# Patient Record
Sex: Female | Born: 1995 | Race: White | Hispanic: No | Marital: Single | Smoking: Current every day smoker
Health system: Southern US, Community
[De-identification: ages and names within clinical notes are randomized; demographics above are authoritative.]

## PROBLEM LIST (undated history)

## (undated) DIAGNOSIS — F419 Anxiety disorder, unspecified: Secondary | ICD-10-CM

## (undated) HISTORY — PX: KNEE ARTHROSCOPY WITH ANTERIOR CRUCIATE LIGAMENT (ACL) REPAIR: SHX5644

## (undated) HISTORY — PX: TONSILLECTOMY: SUR1361

---
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
## 2019-08-24 ENCOUNTER — Other Ambulatory Visit: Payer: Self-pay

## 2019-08-24 ENCOUNTER — Encounter (HOSPITAL_COMMUNITY): Payer: Self-pay | Admitting: Family Medicine

## 2019-08-24 ENCOUNTER — Ambulatory Visit (HOSPITAL_COMMUNITY)
Admission: EM | Admit: 2019-08-24 | Discharge: 2019-08-24 | Disposition: A | Discharge status: Home / Self Care | Payer: PRIVATE HEALTH INSURANCE | Attending: Family Medicine | Admitting: Family Medicine

## 2019-08-24 DIAGNOSIS — K0889 Other specified disorders of teeth and supporting structures: Secondary | ICD-10-CM

## 2019-08-24 MED ORDER — AMOXICILLIN 875 MG PO TABS: 875.0000 mg | ORAL_TABLET | Freq: Two times a day (BID) | ORAL | 0 refills | Status: DC

## 2019-08-24 NOTE — ED Provider Notes (Signed)
[facility]    CSN: [id number] Arrival date & time: 08/24/19  1500      History   Chief Complaint Chief Complaint  Patient presents with  . Dental Pain    HPI Kim Lynn is a 24 y.o. female.   Initial [facility] visit for this 24 yo woman complaining of post extraction wisdom tooth pain.  She's been given opioids by two different dentists this month.  She had all 4 wisdom teeth extracted 2 weeks ago.  Currently she has discomfort in the right jaw wisdom tooth (#32) that has been increasing over the last 2 weeks.  She has been swishing her mouth with the prescription mouthwash provided and she has an appointment tomorrow with her dentist.  She has had no fever, but she does have a bad taste in her mouth.     Past Medical History:  Diagnosis Date  . Anxiety   . Sullivan Lone syndrome     There are no problems to display for this patient.   History reviewed. No pertinent surgical history.  OB History   No obstetric history on file.      Home Medications    Prior to Admission medications   Medication Sig Start Date End Date Taking? Authorizing Provider  amoxicillin (AMOXIL) 875 MG tablet Take 1 tablet (875 mg total) by mouth 2 (two) times daily. 08/24/19   Elvina Sidle, MD    Family History History reviewed. No pertinent family history.  Social History Social History   Tobacco Use  . Smoking status: Not on file  Substance Use Topics  . Alcohol use: Not on file  . Drug use: Not on file     Allergies   Aspirin and Tylenol [acetaminophen]   Review of Systems Review of Systems  HENT: Positive for dental problem.   All other systems reviewed and are negative.    Physical Exam Triage Vital Signs ED Triage Vitals  Enc Vitals Group     BP      Pulse      Resp      Temp      Temp src      SpO2      Weight      Height      Head Circumference      Peak Flow      Pain Score      Pain Loc      Pain Edu?      Excl. in GC?    No data  found.  Updated Vital Signs BP 127/83 (BP Location: Left Arm)   Pulse (!) 103   Temp 98.7 F (37.1 C) (Oral)   Resp 16   LMP 07/28/2019   SpO2 99%    Physical Exam Vitals and nursing note reviewed.  Constitutional:      Appearance: Normal appearance. She is normal weight.  HENT:     Head: Normocephalic.     Mouth/Throat:     Mouth: Mucous membranes are moist.     Comments: No obvious swelling in the mouth or dental areas that I can see. Eyes:     Conjunctiva/sclera: Conjunctivae normal.  Neck:     Comments: Mildly tender fullness in the right submandibular area Cardiovascular:     Rate and Rhythm: Normal rate and regular rhythm.  Pulmonary:     Effort: Pulmonary effort is normal.  Musculoskeletal:        General: Normal range of motion.     Cervical back:  Normal range of motion and neck supple.  Lymphadenopathy:     Cervical: Cervical adenopathy present.  Skin:    General: Skin is warm and dry.  Neurological:     General: No focal deficit present.     Mental Status: She is alert and oriented to person, place, and time.  Psychiatric:        Mood and Affect: Mood normal.        Behavior: Behavior normal.        Thought Content: Thought content normal.        Judgment: Judgment normal.      UC Treatments / Results  Labs (all labs ordered are listed, but only abnormal results are displayed) Labs Reviewed - No data to display  EKG   Radiology No results found.  Procedures Procedures (including critical care time)  Medications Ordered in UC Medications - No data to display  Initial Impression / Assessment and Plan / UC Course  I have reviewed the triage vital signs and the nursing notes.  Pertinent labs & imaging results that were available during my care of the patient were reviewed by me and considered in my medical decision making (see chart for details).    Final Clinical Impressions(s) / UC Diagnoses   Final diagnoses:  Pain, dental      Discharge Instructions     Continue the ibuprofen for discomfort.  Pick up your antibiotic and start today taking twice a day the amoxicillin prescribed.  Keep your appointment with the dentist tomorrow.    ED Prescriptions    Medication Sig Dispense Auth. Provider   amoxicillin (AMOXIL) 875 MG tablet Take 1 tablet (875 mg total) by mouth 2 (two) times daily. 20 tablet Robyn Haber, MD     I have reviewed the PDMP during this encounter.   Robyn Haber, MD 08/24/19 1626

## 2019-08-24 NOTE — Discharge Instructions (Addendum)
Continue the ibuprofen for discomfort.  Pick up your antibiotic and start today taking twice a day the amoxicillin prescribed.  Keep your appointment with the dentist tomorrow.

## 2019-08-24 NOTE — ED Triage Notes (Signed)
Pt present right side swelling from having her tooth pulled. Pt states that the pain is on the right side and causes problems in her throat

## 2020-05-25 ENCOUNTER — Other Ambulatory Visit: Payer: Self-pay

## 2020-05-25 ENCOUNTER — Encounter: Payer: Self-pay | Admitting: Emergency Medicine

## 2020-05-25 ENCOUNTER — Emergency Department
Admission: EM | Admit: 2020-05-25 | Discharge: 2020-05-25 | Disposition: A | Discharge status: Home / Self Care | Payer: 59 | Attending: Emergency Medicine | Admitting: Emergency Medicine

## 2020-05-25 DIAGNOSIS — F17200 Nicotine dependence, unspecified, uncomplicated: Secondary | ICD-10-CM | POA: Insufficient documentation

## 2020-05-25 DIAGNOSIS — G44209 Tension-type headache, unspecified, not intractable: Secondary | ICD-10-CM | POA: Diagnosis not present

## 2020-05-25 DIAGNOSIS — R519 Headache, unspecified: Secondary | ICD-10-CM | POA: Diagnosis present

## 2020-05-25 MED ORDER — DIPHENHYDRAMINE HCL 50 MG/ML IJ SOLN
25.0000 mg | Freq: Once | INTRAMUSCULAR | Status: AC
  Administered 2020-05-25: 25 mg via INTRAVENOUS
  Filled 2020-05-25: qty 1

## 2020-05-25 MED ORDER — IBUPROFEN 600 MG PO TABS: 600.0000 mg | ORAL_TABLET | Freq: Three times a day (TID) | ORAL | 0 refills | Status: DC | PRN

## 2020-05-25 MED ORDER — LACTATED RINGERS IV BOLUS
1000.0000 mL | Freq: Once | INTRAVENOUS | Status: AC
  Administered 2020-05-25: 1000 mL via INTRAVENOUS

## 2020-05-25 MED ORDER — CYCLOBENZAPRINE HCL 10 MG PO TABS: ORAL_TABLET | ORAL | 0 refills | Status: DC

## 2020-05-25 MED ORDER — KETOROLAC TROMETHAMINE 30 MG/ML IJ SOLN
15.0000 mg | Freq: Once | INTRAMUSCULAR | Status: AC
  Administered 2020-05-25: 15 mg via INTRAVENOUS
  Filled 2020-05-25: qty 1

## 2020-05-25 MED ORDER — PROCHLORPERAZINE EDISYLATE 10 MG/2ML IJ SOLN
10.0000 mg | Freq: Once | INTRAMUSCULAR | Status: AC
  Administered 2020-05-25: 10 mg via INTRAVENOUS
  Filled 2020-05-25: qty 2

## 2020-05-25 NOTE — ED Provider Notes (Signed)
[facility] Emergency Department Provider Note  ____________________________________________   Event Date/Time   First MD Initiated Contact with Patient 05/25/20 [phone]     (approximate)  I have reviewed the triage vital signs and the nursing notes.   HISTORY  Chief Complaint Headache    HPI Kim Lynn is a 24 y.o. female  Here with headache. Pt reports gradual onset of initially mild, now progressively worsening aching, throbbing headache. It began around her occipital and temporal area and is now in a band like distribution throughout her head. She's had associated mild nausea, photophobia. No phonophobia. No neck pain or stiffness. No fever, chills. Reports h/o HA but normally is able to take tylenol or motrin with relief. No thunderclap onset. No personal or family h/o aneurysms. No specific alleviating factors.        Past Medical History:  Diagnosis Date  . Anxiety   . Sullivan Lone syndrome     There are no problems to display for this patient.   History reviewed. No pertinent surgical history.  Prior to Admission medications   Medication Sig Start Date End Date Taking? Authorizing Provider  amoxicillin (AMOXIL) 875 MG tablet Take 1 tablet (875 mg total) by mouth 2 (two) times daily. 08/24/19   Elvina Sidle, MD  cyclobenzaprine (FLEXERIL) 10 MG tablet Take three times a day for 2 days, then up to three times a day as needed for muscle tension/spasms or headache. 05/25/20   Shaune Pollack, MD  ibuprofen (ADVIL) 600 MG tablet Take 1 tablet (600 mg total) by mouth every 8 (eight) hours as needed for headache or moderate pain. 05/25/20   Shaune Pollack, MD    Allergies Aspirin and Tylenol [acetaminophen]  No family history on file.  Social History Social History   Tobacco Use  . Smoking status: Current Every Day Smoker  . Smokeless tobacco: Never Used  Vaping Use  . Vaping Use: Never used    Review of Systems  Review of Systems   Constitutional: Positive for fatigue. Negative for fever.  HENT: Negative for congestion and sore throat.   Eyes: Negative for visual disturbance.  Respiratory: Negative for cough and shortness of breath.   Cardiovascular: Negative for chest pain.  Gastrointestinal: Positive for nausea. Negative for abdominal pain, diarrhea and vomiting.  Genitourinary: Negative for flank pain.  Musculoskeletal: Negative for back pain and neck pain.  Skin: Negative for rash and wound.  Neurological: Positive for headaches. Negative for weakness.  All other systems reviewed and are negative.    ____________________________________________  PHYSICAL EXAM:      VITAL SIGNS: ED Triage Vitals [05/25/20 0645]  Enc Vitals Group     BP (!) 130/92     Pulse Rate (!) 119     Resp 18     Temp 98.3 F (36.8 C)     Temp Source Oral     SpO2 98 %     Weight 165 lb (74.8 kg)     Height 5\' 7"  (1.702 m)     Head Circumference      Peak Flow      Pain Score 8     Pain Loc      Pain Edu?      Excl. in GC?      Physical Exam Vitals and nursing note reviewed.  Constitutional:      General: She is not in acute distress.    Appearance: She is well-developed and well-nourished.  HENT:  Head: Normocephalic and atraumatic.  Eyes:     Conjunctiva/sclera: Conjunctivae normal.  Cardiovascular:     Rate and Rhythm: Normal rate and regular rhythm.     Heart sounds: Normal heart sounds.  Pulmonary:     Effort: Pulmonary effort is normal. No respiratory distress.     Breath sounds: No wheezing.  Abdominal:     General: There is no distension.  Musculoskeletal:        General: No edema.     Cervical back: Neck supple.  Skin:    General: Skin is warm.     Capillary Refill: Capillary refill takes less than 2 seconds.     Findings: No rash.  Neurological:     Mental Status: She is alert and oriented to person, place, and time.     Motor: No abnormal muscle tone.      Neurological Exam:  Mental  Status: Alert and oriented to person, place, and time. Attention and concentration normal. Speech clear. Recent memory is intact. Cranial Nerves: Visual fields grossly intact. EOMI and PERRLA. No nystagmus noted. Facial sensation intact at forehead, maxillary cheek, and chin/mandible bilaterally. No facial asymmetry or weakness. Hearing grossly normal. Uvula is midline, and palate elevates symmetrically. Normal SCM and trapezius strength. Tongue midline without fasciculations. Motor: Muscle strength 5/5 in proximal and distal UE and LE bilaterally. No pronator drift. Muscle tone normal. Sensation: Intact to light touch in upper and lower extremities distally bilaterally.  Gait: Normal without ataxia. Coordination: Normal FTN bilaterally.    ____________________________________________   LABS (all labs ordered are listed, but only abnormal results are displayed)  Labs Reviewed - No data to display  ____________________________________________  EKG: None ________________________________________  RADIOLOGY All imaging, including plain films, CT scans, and ultrasounds, independently reviewed by me, and interpretations confirmed via formal radiology reads.  ED MD interpretation:   None  Official radiology report(s): No results found.  ____________________________________________  PROCEDURES   Procedure(s) performed (including Critical Care):  Procedures  ____________________________________________  INITIAL IMPRESSION / MDM / ASSESSMENT AND PLAN / ED COURSE  As part of my medical decision making, I reviewed the following data within the electronic MEDICAL RECORD NUMBER Nursing notes reviewed and incorporated, Old chart reviewed, Notes from prior ED visits, and [place] Controlled Substance Database       *Kim Lynn was evaluated in Emergency Department on 05/25/2020 for the symptoms described in the history of present illness. She was evaluated in the context of the global  COVID-19 pandemic, which necessitated consideration that the patient might be at risk for infection with the SARS-CoV-2 virus that causes COVID-19. Institutional protocols and algorithms that pertain to the evaluation of patients at risk for COVID-19 are in a state of rapid change based on information released by regulatory bodies including the CDC and federal and state organizations. These policies and algorithms were followed during the patient's care in the ED.  Some ED evaluations and interventions may be delayed as a result of limited staffing during the pandemic.*     Medical Decision Making:  24 yo F here with headache. Suspect tension type headache given gradual onset, band-like distribution, worsening w/ jaw clenching and palpation. DDx includes migraine. No thunderclap onset, no neuro deficits, no neck stiffness, no signs to suggest SAH/aneurysm. No signs of CVA. No fever, chills, or signs of meningitis or encephalitis. Pt has had marked improvement w/ fluids, migraine meds in ED. Will d/c with muscle relaxant given tension component, nsaids PRN, and outpt follow-up.  ____________________________________________  FINAL CLINICAL IMPRESSION(S) / ED DIAGNOSES  Final diagnoses:  Acute non intractable tension-type headache     MEDICATIONS GIVEN DURING THIS VISIT:  Medications  lactated ringers bolus 1,000 mL (0 mLs Intravenous Stopped 05/25/20 0858)  prochlorperazine (COMPAZINE) injection 10 mg (10 mg Intravenous Given 05/25/20 0815)  diphenhydrAMINE (BENADRYL) injection 25 mg (25 mg Intravenous Given 05/25/20 0815)  ketorolac (TORADOL) 30 MG/ML injection 15 mg (15 mg Intravenous Given 05/25/20 0815)     ED Discharge Orders         Ordered    cyclobenzaprine (FLEXERIL) 10 MG tablet        05/25/20 0832    ibuprofen (ADVIL) 600 MG tablet  Every 8 hours PRN        05/25/20 7169           Note:  This document was prepared using Dragon voice recognition software and may include  unintentional dictation errors.   Shaune Pollack, MD 05/25/20 [phone]

## 2020-05-25 NOTE — ED Triage Notes (Signed)
Patient ambulatory to triage with steady gait, without difficulty or distress noted; pt reports x 2 days having migraine, this morning pain increased with nausea; st hx migraines

## 2020-05-25 NOTE — Discharge Instructions (Signed)
Try to get at least 8 hr of sleep  Drink plenty of fluids, at least 6-8 glasses of water daily  Take the Ibuprofen and flexeril as prescribed  You were seen and treated in the emergency department today for headache. Fortunately, your vitals, exam, and work-up is reassuring with no apparent emergent cause for your headache at this time. That being said, it is VERY important that you monitor your symptoms at home. If you develop worsening headache, new fever, new neck stiffness, rash, focal weakness or numbness, or any other new or concerning symptoms, please return to the ED immediately, as these may be signs that your headache has become a potentially serious and life-threatening condition.   Drink plenty of fluids at home. This will help with your headache. Try to avoid daily or regular use of tylenol, aspirin, ibuprofen, and other overt-the-counter pain medications as this can contribute to rebound headaches. Follow-up with your primary care doctor.

## 2020-05-25 NOTE — ED Notes (Signed)
Signature pad not available. Pt verbalizes understanding of d/c instructions and when to return to ED. Denies questions or concerns.

## 2020-10-20 ENCOUNTER — Other Ambulatory Visit: Payer: Self-pay

## 2020-10-20 ENCOUNTER — Emergency Department
Admission: EM | Admit: 2020-10-20 | Discharge: 2020-10-20 | Disposition: A | Discharge status: Home / Self Care | Payer: Managed Care, Other (non HMO) | Attending: Emergency Medicine | Admitting: Emergency Medicine

## 2020-10-20 ENCOUNTER — Encounter: Payer: Self-pay | Admitting: *Deleted

## 2020-10-20 DIAGNOSIS — W228XXA Striking against or struck by other objects, initial encounter: Secondary | ICD-10-CM | POA: Diagnosis not present

## 2020-10-20 DIAGNOSIS — Y92002 Bathroom of unspecified non-institutional (private) residence single-family (private) house as the place of occurrence of the external cause: Secondary | ICD-10-CM | POA: Diagnosis not present

## 2020-10-20 DIAGNOSIS — S0990XA Unspecified injury of head, initial encounter: Secondary | ICD-10-CM | POA: Diagnosis present

## 2020-10-20 DIAGNOSIS — S060X0A Concussion without loss of consciousness, initial encounter: Secondary | ICD-10-CM | POA: Diagnosis not present

## 2020-10-20 DIAGNOSIS — F17200 Nicotine dependence, unspecified, uncomplicated: Secondary | ICD-10-CM | POA: Diagnosis not present

## 2020-10-20 MED ORDER — IBUPROFEN 800 MG PO TABS
800.0000 mg | ORAL_TABLET | Freq: Once | ORAL | Status: AC
  Administered 2020-10-20: 800 mg via ORAL
  Filled 2020-10-20: qty 1

## 2020-10-20 MED ORDER — METOCLOPRAMIDE HCL 5 MG PO TABS: 5.0000 mg | ORAL_TABLET | Freq: Three times a day (TID) | ORAL | 0 refills | Status: DC | PRN

## 2020-10-20 MED ORDER — DIPHENHYDRAMINE HCL 25 MG PO CAPS
25.0000 mg | ORAL_CAPSULE | Freq: Once | ORAL | Status: AC
  Administered 2020-10-20: 25 mg via ORAL
  Filled 2020-10-20: qty 1

## 2020-10-20 MED ORDER — METOCLOPRAMIDE HCL 10 MG PO TABS: 10.0000 mg | ORAL_TABLET | Freq: Once | ORAL | Status: DC

## 2020-10-20 MED ORDER — METOCLOPRAMIDE HCL 10 MG PO TABS
10.0000 mg | ORAL_TABLET | Freq: Once | ORAL | Status: AC
  Administered 2020-10-20: 10 mg via ORAL
  Filled 2020-10-20: qty 1

## 2020-10-20 NOTE — ED Triage Notes (Signed)
Pt brought in via ems from work  Pt hit back of head on paper towel holder in bathroom   No swelling no lac  No loc  Pt alert  Speech clear.

## 2020-10-20 NOTE — Discharge Instructions (Addendum)
Your exam is normal and reassuring at this time.  No signs of a serious closed head injury or intracranial bleed.  Your symptoms are more consistent with a mild concussion.  Take over-the-counter Tylenol or Motrin along with prescription nausea medicine as needed.  May also take OTC Benadryl for headache pain relief.  Follow-up with your primary provider or return to the ED if necessary.

## 2020-10-20 NOTE — ED Provider Notes (Signed)
[facility] Emergency Department Provider Note  ____________________________________________   Event Date/Time   First MD Initiated Contact with Patient 10/20/20 1910     (approximate)  I have reviewed the triage vital signs and the nursing notes.   HISTORY  Chief Complaint Head Injury  HPI Kim Lynn is a 25 y.o. female presents to the ED via EMS, for evaluation of a head injury.  She reportedly stood up while in the bathroom, and inadvertently hit the back of her head on the wall attached paper towel rack.  Patient reports her vision went black for several seconds, but denies any loss of consciousness.  Has any swelling to the scalp or laceration to the head.  She denies any ongoing visual disturbance but does note feeling like she is "floating."  Patient has had multiple concussions in the past, and reports her symptoms feel similar.  She reports a continued posterior headache but denies any distal paresthesias, weakness, or paralysis.  She also denies any other injury at this time.  Patient rates her headache at a 6 out of 10 at this time.   Reports the incident happened at about 5:00 at work.  She remained at work but was seated in the managers office for least an hour before she was advised to report to the ED.  She denies any interim worsening of her symptoms including nausea or vomiting.   Past Medical History:  Diagnosis Date  . Anxiety   . Sullivan Lone syndrome     There are no problems to display for this patient.   History reviewed. No pertinent surgical history.  Prior to Admission medications   Medication Sig Start Date End Date Taking? Authorizing Provider  metoCLOPramide (REGLAN) 5 MG tablet Take 1 tablet (5 mg total) by mouth every 8 (eight) hours as needed for nausea or vomiting. 10/20/20  Yes Jabes Primo, Charlesetta Ivory, PA-C    Allergies Aspirin and Tylenol [acetaminophen]  History reviewed. No pertinent family history.  Social  History Social History   Tobacco Use  . Smoking status: Current Every Day Smoker  . Smokeless tobacco: Never Used  Vaping Use  . Vaping Use: Never used  Substance Use Topics  . Alcohol use: Not Currently    Review of Systems  Constitutional: No fever/chills Eyes: No visual changes. ENT: No sore throat. Cardiovascular: Denies chest pain. Respiratory: Denies shortness of breath. Gastrointestinal: No abdominal pain.  No nausea, no vomiting.  No diarrhea.  No constipation. Genitourinary: Negative for dysuria. Musculoskeletal: Negative for back pain. Skin: Negative for rash. Neurological: Positive for headaches.  Denies any focal weakness or numbness. ____________________________________________   PHYSICAL EXAM:  VITAL SIGNS: ED Triage Vitals  Enc Vitals Group     BP      Pulse      Resp      Temp      Temp src      SpO2      Weight      Height      Head Circumference      Peak Flow      Pain Score      Pain Loc      Pain Edu?      Excl. in GC?     Constitutional: Alert and oriented. Well appearing and in no acute distress. Eyes: Conjunctivae are normal. PERRL. EOMI. normal fundi bilaterally. Head: Atraumatic.  Mildly tender to palpation to the crown of the head.  No hematoma, or abrasion, or  erythema is appreciated. Nose: No congestion/rhinnorhea. Mouth/Throat: Mucous membranes are moist.  Oropharynx non-erythematous. Neck: No stridor.  No cervical spine tenderness to palpation. Cardiovascular: Normal rate, regular rhythm. Grossly normal heart sounds.  Good peripheral circulation. Respiratory: Normal respiratory effort.  No retractions. Lungs CTAB. Gastrointestinal: Soft and nontender. No distention. No abdominal bruits. No CVA tenderness. Musculoskeletal: No lower extremity tenderness nor edema.  No joint effusions. Neurologic: Cranial nerves II to XII grossly intact.  Normal tandem walk.  No cerebellar ataxia appreciated.  Normal finger-nose exam.  Negative  Romberg and no pronator drift..  Normal speech and language. No gross focal neurologic deficits are appreciated. No gait instability. Skin:  Skin is warm, dry and intact. No rash noted. Psychiatric: Mood and affect are normal. Speech and behavior are normal.  ____________________________________________   LABS (all labs ordered are listed, but only abnormal results are displayed)  Labs Reviewed - No data to display ____________________________________________  EKG  ____________________________________________  RADIOLOGY I, Lissa Hoard, personally viewed and evaluated these images (plain radiographs) as part of my medical decision making, as well as reviewing the written report by the radiologist.  ED MD interpretation:    Official radiology report(s): No results found.  ____________________________________________   PROCEDURES  Procedure(s) performed (including Critical Care):  Procedures   ____________________________________________   INITIAL IMPRESSION / ASSESSMENT AND PLAN / ED COURSE  As part of my medical decision making, I reviewed the following data within the electronic MEDICAL RECORD NUMBER Notes from prior ED visits   Patient with ED evaluation of a closed head injury. Her exam is benign and reassuring. The incident happened at least an hour prior to her arrival. She reports no worsening of her symptoms, intractable N/V, weakness, or syncope.  No exam evidence of any acute cerebellar ataxia or significant closed head injury.  We discussed the availability of CT imaging at this time.  Patient with a history of multiple head injuries and concussions in the past, declined at this time.  She is well aware of postconcussive symptom management.  She is at this time however, requesting some medicine for treatment of her mild headache.  She will be discharged with a prescription for Reglan as well as [place] take over-the-counter Benadryl with ibuprofen.  She will  follow-up with primary provider return to the ED if necessary.  Work note is provided as requested.  ____________________________________________   FINAL CLINICAL IMPRESSION(S) / ED DIAGNOSES  Final diagnoses:  Closed head injury, initial encounter  Concussion without loss of consciousness, initial encounter     ED Discharge Orders         Ordered    metoCLOPramide (REGLAN) 5 MG tablet  Every 8 hours PRN        10/20/20 2040          *Please note:  Kim Lynn was evaluated in Emergency Department on 10/20/2020 for the symptoms described in the history of present illness. She was evaluated in the context of the global COVID-19 pandemic, which necessitated consideration that the patient might be at risk for infection with the SARS-CoV-2 virus that causes COVID-19. Institutional protocols and algorithms that pertain to the evaluation of patients at risk for COVID-19 are in a state of rapid change based on information released by regulatory bodies including the CDC and federal and state organizations. These policies and algorithms were followed during the patient's care in the ED.  Some ED evaluations and interventions may be delayed as a result of limited staffing during  and the pandemic.*   Note:  This document was prepared using Dragon voice recognition software and may include unintentional dictation errors.    Lissa Hoard, PA-C 10/20/20 2049    Chesley Noon, MD 10/20/20 [phone]

## 2021-02-10 LAB — OB RESULTS CONSOLE RUBELLA ANTIBODY, IGM: Rubella: IMMUNE

## 2021-02-10 LAB — OB RESULTS CONSOLE VARICELLA ZOSTER ANTIBODY, IGG: Varicella: NON-IMMUNE/NOT IMMUNE

## 2021-06-12 NOTE — L&D Delivery Note (Signed)
Delivery Note ? ?First Stage: ?Augmentation: Cytotec, Cook Cath, Pitocin, AROM ?Analgesia /Anesthesia intrapartum: stadol, epidural ?AROM at 1129 ? ?Second Stage: ?Complete dilation at 1725 ?Onset of pushing at 1730 ?FHR second stage Cat II tracing, variables and earlies.   ? ?Delivery of a viable female infant on 08/16/21 at 1813 by CNM ?delivery of fetal head in OA position with restitution to LOA. ?No nuchal cord;  Anterior then posterior shoulders delivered easily with gentle downward traction. Short cord noted.  Baby placed on mom's chest, and attended to by peds.  ?Cord double clamped after cessation of pulsation, cut by FOB ? ?Third Stage: ?Placenta delivered spontaneously intact with Hastings @ 1818 ?Placenta disposition: routine disposal ?Uterine tone Firm with massage / bleeding small ? ?2nd deg laceration identified  ?Anesthesia for repair: epidural ?Repair 2-0 Vicryl Ct-1; hemostatic right labial/periurethral. ?Est. Blood Loss (mL): 477ml ? ?Complications: none ? ?Mom to postpartum.  Baby to Couplet care / Skin to Skin. ? ?Newborn: ?Birth Weight: 3450g, 7#10  ?Apgar Scores: 8/9 ?Feeding planned: breast ? ? ? ?

## 2021-07-22 LAB — OB RESULTS CONSOLE GC/CHLAMYDIA
Chlamydia: NEGATIVE
Gonorrhea: NEGATIVE

## 2021-07-22 LAB — OB RESULTS CONSOLE GBS: GBS: NEGATIVE

## 2021-07-22 LAB — OB RESULTS CONSOLE HIV ANTIBODY (ROUTINE TESTING): HIV: NONREACTIVE

## 2021-08-03 ENCOUNTER — Other Ambulatory Visit: Payer: Self-pay | Admitting: Obstetrics

## 2021-08-03 DIAGNOSIS — Z3490 Encounter for supervision of normal pregnancy, unspecified, unspecified trimester: Secondary | ICD-10-CM

## 2021-08-03 NOTE — Progress Notes (Signed)
No obstetric history on file. at [redacted]w[redacted]d, LMP of 11/09/20, c/w early [place] at [redacted]w[redacted]d.  Scheduled for induction of labor for Chronic respiratory issue( persistent asthma, and Covid on 3-7/23.   Prenatal provider: [facility] Pregnancy complicated by: 1.Asthma with COVID infx 07/01/21 2.Maternal mental health diagnosis  3. Varicella non-immune 4.Elevated 1hr GTT   Prenatal Labs: Blood type/Rh AB Pos  Antibody screen neg  Rubella Immune  Varicella Non Immune  RPR NR  HBsAg Neg  HIV NR  GC neg  Chlamydia neg  Genetic screening cfDNA negative  1 hour GTT 173  3 hour GTT 93,210,152,99  GBS Neg   Tdap: 05/26/21 Flu: received antepartum through work Contraception: Arts development officer Feeding preference: Breast  ____ Chari Manning CNM Certified Nurse Midwife [facility]

## 2021-08-16 ENCOUNTER — Inpatient Hospital Stay
Admission: EM | Admit: 2021-08-16 | Discharge: 2021-08-18 | DRG: 806 | Disposition: A | Discharge status: Home / Self Care | Payer: 59 | Attending: Obstetrics | Admitting: Obstetrics

## 2021-08-16 ENCOUNTER — Inpatient Hospital Stay: Payer: 59 | Admitting: Anesthesiology

## 2021-08-16 ENCOUNTER — Encounter: Payer: Self-pay | Admitting: Obstetrics and Gynecology

## 2021-08-16 ENCOUNTER — Other Ambulatory Visit: Payer: Self-pay

## 2021-08-16 DIAGNOSIS — J45909 Unspecified asthma, uncomplicated: Secondary | ICD-10-CM | POA: Diagnosis present

## 2021-08-16 DIAGNOSIS — Z3A40 40 weeks gestation of pregnancy: Secondary | ICD-10-CM

## 2021-08-16 DIAGNOSIS — Z8616 Personal history of COVID-19: Secondary | ICD-10-CM

## 2021-08-16 DIAGNOSIS — F17200 Nicotine dependence, unspecified, uncomplicated: Secondary | ICD-10-CM | POA: Diagnosis present

## 2021-08-16 DIAGNOSIS — Z20822 Contact with and (suspected) exposure to covid-19: Secondary | ICD-10-CM | POA: Diagnosis present

## 2021-08-16 DIAGNOSIS — O9952 Diseases of the respiratory system complicating childbirth: Secondary | ICD-10-CM | POA: Diagnosis present

## 2021-08-16 DIAGNOSIS — D62 Acute posthemorrhagic anemia: Secondary | ICD-10-CM | POA: Diagnosis not present

## 2021-08-16 DIAGNOSIS — Z23 Encounter for immunization: Secondary | ICD-10-CM | POA: Diagnosis not present

## 2021-08-16 DIAGNOSIS — Z3490 Encounter for supervision of normal pregnancy, unspecified, unspecified trimester: Secondary | ICD-10-CM | POA: Diagnosis present

## 2021-08-16 DIAGNOSIS — O9081 Anemia of the puerperium: Secondary | ICD-10-CM | POA: Diagnosis not present

## 2021-08-16 DIAGNOSIS — O99334 Smoking (tobacco) complicating childbirth: Secondary | ICD-10-CM | POA: Diagnosis present

## 2021-08-16 LAB — CBC
HCT: 34.3 % — ABNORMAL LOW (ref 36.0–46.0)
Hemoglobin: 11.3 g/dL — ABNORMAL LOW (ref 12.0–15.0)
MCH: 28.1 pg (ref 26.0–34.0)
MCHC: 32.9 g/dL (ref 30.0–36.0)
MCV: 85.3 fL (ref 80.0–100.0)
Platelets: 359 10*3/uL (ref 150–400)
RBC: 4.02 MIL/uL (ref 3.87–5.11)
RDW: 13.8 % (ref 11.5–15.5)
WBC: 12.7 10*3/uL — ABNORMAL HIGH (ref 4.0–10.5)
nRBC: 0 % (ref 0.0–0.2)

## 2021-08-16 LAB — TYPE AND SCREEN
ABO/RH(D): AB POS
Antibody Screen: NEGATIVE

## 2021-08-16 LAB — RESP PANEL BY RT-PCR (FLU A&B, COVID) ARPGX2
Influenza A by PCR: NEGATIVE
Influenza B by PCR: NEGATIVE
SARS Coronavirus 2 by RT PCR: NEGATIVE

## 2021-08-16 LAB — RPR: RPR Ser Ql: NONREACTIVE

## 2021-08-16 LAB — ABO/RH: ABO/RH(D): AB POS

## 2021-08-16 MED ORDER — OXYTOCIN 10 UNIT/ML IJ SOLN
INTRAMUSCULAR | Status: AC
  Filled 2021-08-16: qty 2

## 2021-08-16 MED ORDER — SENNOSIDES-DOCUSATE SODIUM 8.6-50 MG PO TABS
2.0000 | ORAL_TABLET | Freq: Every day | ORAL | Status: DC
  Administered 2021-08-17 – 2021-08-18 (×2): 2 via ORAL
  Filled 2021-08-16 (×2): qty 2

## 2021-08-16 MED ORDER — DIBUCAINE (PERIANAL) 1 % EX OINT
1.0000 | TOPICAL_OINTMENT | CUTANEOUS | Status: DC | PRN
Start: 2021-08-16 — End: 2021-08-18
  Administered 2021-08-17: 1 via RECTAL
  Filled 2021-08-16 (×2): qty 28

## 2021-08-16 MED ORDER — LIDOCAINE HCL (PF) 1 % IJ SOLN
INTRAMUSCULAR | Status: AC
  Filled 2021-08-16: qty 30

## 2021-08-16 MED ORDER — LACTATED RINGERS IV SOLN: 500.0000 mL | Freq: Once | INTRAVENOUS | Status: DC

## 2021-08-16 MED ORDER — LACTATED RINGERS IV SOLN: INTRAVENOUS | Status: DC

## 2021-08-16 MED ORDER — SIMETHICONE 80 MG PO CHEW
80.0000 mg | CHEWABLE_TABLET | ORAL | Status: DC | PRN
  Administered 2021-08-16: 80 mg via ORAL
  Filled 2021-08-16: qty 1

## 2021-08-16 MED ORDER — LIDOCAINE-EPINEPHRINE (PF) 1.5 %-1:200000 IJ SOLN
INTRAMUSCULAR | Status: DC | PRN
  Administered 2021-08-16: 5 mL via PERINEURAL

## 2021-08-16 MED ORDER — BENZOCAINE-MENTHOL 20-0.5 % EX AERO
1.0000 "application " | INHALATION_SPRAY | CUTANEOUS | Status: DC | PRN
  Administered 2021-08-16: 1 via TOPICAL
  Filled 2021-08-16: qty 56

## 2021-08-16 MED ORDER — ZOLPIDEM TARTRATE 5 MG PO TABS
10.0000 mg | ORAL_TABLET | Freq: Every evening | ORAL | Status: DC | PRN
  Administered 2021-08-16: 10 mg via ORAL
  Filled 2021-08-16: qty 2

## 2021-08-16 MED ORDER — PHENYLEPHRINE 40 MCG/ML (10ML) SYRINGE FOR IV PUSH (FOR BLOOD PRESSURE SUPPORT): 80.0000 ug | PREFILLED_SYRINGE | INTRAVENOUS | Status: DC | PRN

## 2021-08-16 MED ORDER — MISOPROSTOL 200 MCG PO TABS
800.0000 ug | ORAL_TABLET | Freq: Once | ORAL | Status: AC
  Administered 2021-08-16: 800 ug via RECTAL

## 2021-08-16 MED ORDER — LACTATED RINGERS IV SOLN
500.0000 mL | INTRAVENOUS | Status: DC | PRN
  Administered 2021-08-16: 500 mL via INTRAVENOUS

## 2021-08-16 MED ORDER — EPHEDRINE 5 MG/ML INJ: 10.0000 mg | INTRAVENOUS | Status: DC | PRN

## 2021-08-16 MED ORDER — IBUPROFEN 600 MG PO TABS
600.0000 mg | ORAL_TABLET | Freq: Four times a day (QID) | ORAL | Status: DC
  Administered 2021-08-16 – 2021-08-18 (×8): 600 mg via ORAL
  Filled 2021-08-16 (×8): qty 1

## 2021-08-16 MED ORDER — WITCH HAZEL-GLYCERIN EX PADS
1.0000 "application " | MEDICATED_PAD | CUTANEOUS | Status: DC | PRN
  Administered 2021-08-16: 1 via TOPICAL
  Filled 2021-08-16: qty 100

## 2021-08-16 MED ORDER — FENTANYL-BUPIVACAINE-NACL 0.5-0.125-0.9 MG/250ML-% EP SOLN
12.0000 mL/h | EPIDURAL | Status: DC | PRN
  Administered 2021-08-16: 12 mL/h via EPIDURAL

## 2021-08-16 MED ORDER — PRENATAL MULTIVITAMIN CH
1.0000 | ORAL_TABLET | Freq: Every day | ORAL | Status: DC
  Administered 2021-08-17 – 2021-08-18 (×2): 1 via ORAL
  Filled 2021-08-16 (×2): qty 1

## 2021-08-16 MED ORDER — COCONUT OIL OIL
1.0000 | TOPICAL_OIL | Status: DC | PRN
Start: 2021-08-16 — End: 2021-08-18
  Administered 2021-08-16: 1 via TOPICAL
  Filled 2021-08-16: qty 120

## 2021-08-16 MED ORDER — LIDOCAINE HCL (PF) 1 % IJ SOLN: 30.0000 mL | INTRAMUSCULAR | Status: DC | PRN

## 2021-08-16 MED ORDER — ONDANSETRON HCL 4 MG PO TABS
4.0000 mg | ORAL_TABLET | ORAL | Status: DC | PRN
Start: 2021-08-16 — End: 2021-08-18

## 2021-08-16 MED ORDER — OXYTOCIN-SODIUM CHLORIDE 30-0.9 UT/500ML-% IV SOLN
1.0000 m[IU]/min | INTRAVENOUS | Status: DC
  Administered 2021-08-16: 2 m[IU]/min via INTRAVENOUS
  Filled 2021-08-16: qty 1000

## 2021-08-16 MED ORDER — BUPIVACAINE HCL (PF) 0.25 % IJ SOLN
INTRAMUSCULAR | Status: DC | PRN
Start: 2021-08-16 — End: 2021-08-16
  Administered 2021-08-16 (×2): 4 mL via EPIDURAL

## 2021-08-16 MED ORDER — FENTANYL-BUPIVACAINE-NACL 0.5-0.125-0.9 MG/250ML-% EP SOLN
EPIDURAL | Status: AC
  Filled 2021-08-16: qty 250

## 2021-08-16 MED ORDER — MISOPROSTOL 200 MCG PO TABS
ORAL_TABLET | ORAL | Status: AC
  Administered 2021-08-16: 25 ug via VAGINAL
  Filled 2021-08-16: qty 4

## 2021-08-16 MED ORDER — AMMONIA AROMATIC IN INHA
RESPIRATORY_TRACT | Status: AC
  Filled 2021-08-16: qty 10

## 2021-08-16 MED ORDER — MISOPROSTOL 25 MCG QUARTER TABLET
25.0000 ug | ORAL_TABLET | ORAL | Status: DC | PRN
  Filled 2021-08-16: qty 1

## 2021-08-16 MED ORDER — DIPHENHYDRAMINE HCL 50 MG/ML IJ SOLN: 12.5000 mg | INTRAMUSCULAR | Status: DC | PRN

## 2021-08-16 MED ORDER — ONDANSETRON HCL 4 MG/2ML IJ SOLN: 4.0000 mg | INTRAMUSCULAR | Status: DC | PRN

## 2021-08-16 MED ORDER — SOD CITRATE-CITRIC ACID 500-334 MG/5ML PO SOLN: 30.0000 mL | ORAL | Status: DC | PRN

## 2021-08-16 MED ORDER — LIDOCAINE HCL (PF) 1 % IJ SOLN
INTRAMUSCULAR | Status: DC | PRN
Start: 2021-08-16 — End: 2021-08-16
  Administered 2021-08-16: 3 mL

## 2021-08-16 MED ORDER — MISOPROSTOL 25 MCG QUARTER TABLET
25.0000 ug | ORAL_TABLET | ORAL | Status: DC | PRN
  Administered 2021-08-16: 25 ug via BUCCAL
  Filled 2021-08-16: qty 1

## 2021-08-16 MED ORDER — ONDANSETRON HCL 4 MG/2ML IJ SOLN
4.0000 mg | Freq: Four times a day (QID) | INTRAMUSCULAR | Status: DC | PRN
  Administered 2021-08-16: 4 mg via INTRAVENOUS
  Filled 2021-08-16: qty 2

## 2021-08-16 MED ORDER — OXYTOCIN-SODIUM CHLORIDE 30-0.9 UT/500ML-% IV SOLN
2.5000 [IU]/h | INTRAVENOUS | Status: DC
  Administered 2021-08-16: 2.5 [IU]/h via INTRAVENOUS

## 2021-08-16 MED ORDER — DIPHENHYDRAMINE HCL 25 MG PO CAPS: 25.0000 mg | ORAL_CAPSULE | Freq: Four times a day (QID) | ORAL | Status: DC | PRN

## 2021-08-16 MED ORDER — BUTORPHANOL TARTRATE 1 MG/ML IJ SOLN
1.0000 mg | INTRAMUSCULAR | Status: DC | PRN
  Administered 2021-08-16 (×2): 1 mg via INTRAVENOUS
  Filled 2021-08-16 (×2): qty 1

## 2021-08-16 MED ORDER — OXYTOCIN BOLUS FROM INFUSION
333.0000 mL | Freq: Once | INTRAVENOUS | Status: AC
  Administered 2021-08-16: 333 mL via INTRAVENOUS

## 2021-08-16 MED ORDER — TERBUTALINE SULFATE 1 MG/ML IJ SOLN: 0.2500 mg | Freq: Once | INTRAMUSCULAR | Status: DC | PRN

## 2021-08-16 NOTE — Progress Notes (Signed)
Labor Progress Note ? ?Kim Lynn is a 26 y.o. G1P0 at [redacted]w[redacted]d by LMP admitted for induction of labor due to Asthma with a hx of Covid during pregnancy. ? ?Subjective: resting in bed with family at bedside. She reports that she is now cramping with contractions and is still very anxious. ? ?Objective: ?BP 120/75 (BP Location: Left Arm)   Pulse (!) 112   Temp 98.2 ?F (36.8 ?C) (Oral)   Resp 18   Ht 5\' 7"  (1.702 m)   Wt 82.6 kg   LMP 11/09/2020   BMI 28.51 kg/m?  ?Notable VS details:  ? ?Fetal Assessment: ?FHT:  FHR: 120 bpm, variability: moderate,  accelerations:  Present,  decelerations:  Absent ?Category/reactivity:  Category I ?UC:   irregular, every 2-4 minutes ?SVE:   1/60/-1 ?Membrane status: intact ? ?Labs: ?Lab Results  ?Component Value Date  ? WBC 12.7 (H) 08/16/2021  ? HGB 11.3 (L) 08/16/2021  ? HCT 34.3 (L) 08/16/2021  ? MCV 85.3 08/16/2021  ? PLT 359 08/16/2021  ? ? ?Assessment / Plan: ?Induction of labor progressing normally on 1 dose of cervical and buccal Cytotec. Discussed with patient the plan to place a cook cath cervical balloon and start Pitocin. Patient agreeable with plan. All questions answered. ? ?Labor: Progressing normally ?Preeclampsia:   N/A ?Fetal Wellbeing:  Category I ?Pain Control:  Epidural and IV pain meds ?I/D:   GBS neg, membranes intact ?Anticipated MOD:  NSVD ? ?Patient tolerated procedure well ? ?[place] Lenoard Aden, CNM ?08/16/2021, 8:57 AM ? ? ? ? ? ? ?  ?

## 2021-08-16 NOTE — H&P (Addendum)
OB History & Physical  ? ?History of Present Illness:  ? ?Chief Complaint: IOL at 40 weeks for chronic respiratory issues ? ?HPI:  ?Kim Lynn is a 26 y.o. G1P0 female at [redacted]w[redacted]d dated by LMP c/w early [place] at 7.0 weeks.  She presents to L&D for IOL ? ?Reports active fetal movement  ?Contractions: every 2 to 4 minutes ?LOF/SROM: intact ?Vaginal bleeding: none ? ?Factors complicating pregnancy:  ?1.Asthma with COVID infx 07/01/21 ?2.Maternal mental health diagnosis  ?3. Varicella non-immune ?4.Elevated 1hr GTT ? ?Patient Active Problem List  ? Diagnosis Date Noted  ? Encounter for planned induction of labor 08/16/2021  ? ? ? ?Maternal Medical History:  ? ?Past Medical History:  ?Diagnosis Date  ? Anxiety   ? Sullivan Lone syndrome   ? ? ?Past Surgical History:  ?Procedure Laterality Date  ? KNEE ARTHROSCOPY WITH ANTERIOR CRUCIATE LIGAMENT (ACL) REPAIR    ? TONSILLECTOMY    ? ? ?Allergies  ?Allergen Reactions  ? Aspirin Other (See Comments)  ?  Liver disorder  ? Tylenol [Acetaminophen] Other (See Comments)  ?  Liver disorder  ? ? ?Prior to Admission medications   ?Medication Sig Start Date End Date Taking? Authorizing Provider  ?Prenatal Vit-Fe Fumarate-FA (MULTIVITAMIN-PRENATAL) 27-0.8 MG TABS tablet Take 1 tablet by mouth daily at 12 noon.   Yes [provider]  ?metoCLOPramide (REGLAN) 5 MG tablet Take 1 tablet (5 mg total) by mouth every 8 (eight) hours as needed for nausea or vomiting. ?Patient not taking: Reported on 08/16/2021 10/20/20   Menshew, Charlesetta Ivory, PA-C  ? ? ? ?Prenatal care site:  ?[facility] ? ?Social History: She  reports that she has been smoking. She has never used smokeless tobacco. She reports that she does not currently use alcohol. She reports that she does not use drugs. ? ?Family History: family history is not on file.  ? ?Review of Systems: A full review of systems was performed and negative except as noted in the HPI.   ? ? ?Physical Exam:  ?Vital Signs: BP 120/75 (BP  Location: Left Arm)   Pulse (!) 112   Temp 98.2 ?F (36.8 ?C) (Oral)   Resp 18   Ht 5\' 7"  (1.702 m)   Wt 82.6 kg   LMP 11/09/2020   BMI 28.51 kg/m?  ?Physical Exam ?Constitutional:   ?   Appearance: Normal appearance.  ?HENT:  ?   Head: Normocephalic.  ?Cardiovascular:  ?   Rate and Rhythm: Normal rate and regular rhythm.  ?   Pulses: Normal pulses.  ?   Heart sounds: Normal heart sounds.  ?Pulmonary:  ?   Effort: Pulmonary effort is normal.  ?   Breath sounds: Normal breath sounds.  ?Abdominal:  ?   Palpations: Abdomen is soft.  ?   Comments: gravid  ?Genitourinary: ?   General: Normal vulva.  ?   Vagina: Normal.  ?   Cervix: Dilated.  ?   Uterus: Enlarged.   ?Musculoskeletal:     ?   General: Normal range of motion.  ?   Cervical back: Normal range of motion and neck supple.  ?Skin: ?   General: Skin is warm and dry.  ?Neurological:  ?   Mental Status: She is alert and oriented to person, place, and time.  ?Psychiatric:     ?   Mood and Affect: Mood normal.     ?   Behavior: Behavior normal.     ?   Thought Content:  Thought content normal.     ?   Judgment: Judgment normal.  ? ? ?General: no acute distress.  ?HEENT: normocephalic, atraumatic ?Heart: regular rate & rhythm.  No murmurs/rubs/gallops ?Lungs: clear to auscultation bilaterally, normal respiratory effort ?Abdomen: soft, gravid, non-tender;  EFW: 7 lbs ?Pelvic:  ? External: Normal external female genitalia ? Cervix: Dilation: 1 / Effacement (%): 60 / Station: -1  ?  ?Extremities: non-tender, symmetric, no edema bilaterally.  DTRs: +2  ?Neurologic: Alert & oriented x 3.   ? ?Results for orders placed or performed during the hospital encounter of 08/16/21 (from the past 24 hour(s))  ?CBC     Status: Abnormal  ? Collection Time: 08/16/21 12:41 AM  ?Result Value Ref Range  ? WBC 12.7 (H) 4.0 - 10.5 K/uL  ? RBC 4.02 3.87 - 5.11 MIL/uL  ? Hemoglobin 11.3 (L) 12.0 - 15.0 g/dL  ? HCT 34.3 (L) 36.0 - 46.0 %  ? MCV 85.3 80.0 - 100.0 fL  ? MCH 28.1 26.0 - 34.0  pg  ? MCHC 32.9 30.0 - 36.0 g/dL  ? RDW 13.8 11.5 - 15.5 %  ? Platelets 359 150 - 400 K/uL  ? nRBC 0.0 0.0 - 0.2 %  ?Type and screen     Status: None (Preliminary result)  ? Collection Time: 08/16/21 12:41 AM  ?Result Value Ref Range  ? ABO/RH(D) PENDING   ? Antibody Screen PENDING   ? Sample Expiration    ?  08/19/2021,2359 ?Performed at [facility], [address], [place], [place] [address] ?  ?Resp Panel by RT-PCR (Flu A&B, Covid) Nasopharyngeal Swab     Status: None  ? Collection Time: 08/16/21 12:41 AM  ? Specimen: Nasopharyngeal Swab; Nasopharyngeal(NP) swabs in vial transport medium  ?Result Value Ref Range  ? SARS Coronavirus 2 by RT PCR NEGATIVE NEGATIVE  ? Influenza A by PCR NEGATIVE NEGATIVE  ? Influenza B by PCR NEGATIVE NEGATIVE  ?Type and screen     Status: None  ? Collection Time: 08/16/21  2:02 AM  ?Result Value Ref Range  ? ABO/RH(D) AB POS   ? Antibody Screen NEG   ? Sample Expiration    ?  08/19/2021,2359 ?Performed at [facility], [address] [place], [place] [address] ?  ?ABO/Rh     Status: None  ? Collection Time: 08/16/21  2:04 AM  ?Result Value Ref Range  ? ABO/RH(D)    ?  AB POS ?Performed at [facility], [address] [place], [place] [address] ?  ? ? ?Pertinent Results:  ?Prenatal Labs: ?Blood type/Rh AB Pos  ?Antibody screen neg  ?Rubella Immune  ?Varicella Non Immune  ?RPR NR  ?HBsAg Neg  ?HIV NR  ?GC neg  ?Chlamydia neg  ?Genetic screening cfDNA negative  ?1 hour GTT 173  ?3 hour GTT [id number]  ?GBS Neg  ? ?FHT:  FHR: 120 bpm, variability: moderate,  accelerations:  Present,  decelerations:  Absent ?Category/reactivity:  Category I ?UC:   irregular, every 2-4 minutes ?  ?Cephalic by Leopolds and SVE  ? ?No results found. ? ?Assessment:  ?Kim Lynn is a 26 y.o. G1P0 female at [redacted]w[redacted]d with Asthma and hx of Covid infection in pregnancy..  ? ?Plan:  ?1. Admit to Labor & Delivery; consents reviewed and obtained ?- Covid admission screen  ? ?2.  Fetal Well being  ?- Fetal Tracing: Category 1 ?- Group B Streptococcus ppx not indicated: GBS neg ?- Presentation: cephalic confirmed by SVE  ? ?  3. Routine OB: ?- Prenatal labs reviewed, as above ?- Rh pos ?- CBC, T&S, RPR on admit ?- Clear fluids, IVF ? ?4. Induction of labor  ?- Contractions monitored with external toco ?- Pelvis adequate for trial of labor  ?- Plan for induction with misoprostol, oxytocin, and cervical balloon  ?- Augmentation with oxytocin and AROM as appropriate  ?- Plan for  continuous fetal monitoring ?- Maternal pain control as desired; planning regional anesthesia and IVPM ?- Anticipate vaginal delivery ? ?5. Post Partum Planning: ?- Infant feeding: Breast ?- Contraception: Liletta IUD ?- Tdap vaccine: 05/26/21 ?- Flu vaccine: received antepartum through work ? ? ? ?Chari Manning, CNM ?Certified Nurse Midwife ?[facility] ?[facility]  ? ?  ?

## 2021-08-16 NOTE — Anesthesia Preprocedure Evaluation (Addendum)
Anesthesia Evaluation  ?Patient identified by MRN, date of birth, ID band ?Patient awake ? ? ? ?Reviewed: ?Allergy & Precautions, H&P , NPO status , Patient's Chart, lab work & pertinent test results ? ?Airway ?Mallampati: II ? ?TM Distance: >3 FB ?Neck ROM: full ? ? ? Dental ?no notable dental hx. ? ?  ?Pulmonary ?asthma , Current Smoker,  ?  ? ? ? ? ? ? ? Cardiovascular ?negative cardio ROS ? ? ? ? ?  ?Neuro/Psych ?Anxiety negative neurological ROS ?   ? GI/Hepatic ?Neg liver ROS, GERD  ,  ?Endo/Other  ?negative endocrine ROS ? Renal/GU ?  ?negative genitourinary ?  ?Musculoskeletal ? ? Abdominal ?  ?Peds ? Hematology ?negative hematology ROS ?(+)   ?Anesthesia Other Findings ? ? Reproductive/Obstetrics ?(+) Pregnancy ? ?  ? ? ? ? ? ? ? ? ? ? ? ? ? ?  ?  ? ? ? ? ? ? ? ?Anesthesia Physical ?Anesthesia Plan ? ?ASA: 2 ? ?Anesthesia Plan: Epidural  ? ?Post-op Pain Management:   ? ?Induction:  ? ?PONV Risk Score and Plan:  ? ?Airway Management Planned:  ? ?Additional Equipment:  ? ?Intra-op Plan:  ? ?Post-operative Plan:  ? ?Informed Consent: I have reviewed the patients History and Physical, chart, labs and discussed the procedure including the risks, benefits and alternatives for the proposed anesthesia with the patient or authorized representative who has indicated his/her understanding and acceptance.  ? ? ? ?Dental Advisory Given ? ?Plan Discussed with: Anesthesiologist and CRNA ? ?Anesthesia Plan Comments:   ? ? ? ? ? ? ?Anesthesia Quick Evaluation ? ?

## 2021-08-16 NOTE — Anesthesia Procedure Notes (Addendum)
Epidural ?Patient location during procedure: OB ?Start time: 08/16/2021 12:22 PM ?End time: 08/16/2021 12:31 PM ? ?Staffing ?Anesthesiologist: Piscitello, Cleda Mccreedy, MD ?Resident/CRNA: Irving Burton, CRNA ?Performed: resident/CRNA  ? ?Preanesthetic Checklist ?Completed: patient identified, IV checked, site marked, risks and benefits discussed, surgical consent, monitors and equipment checked, pre-op evaluation and timeout performed ? ?Epidural ?Patient position: sitting ?Prep: ChloraPrep ?Patient monitoring: heart rate, continuous pulse ox and blood pressure ?Approach: midline ?Location: L3-L4 ?Injection technique: LOR saline ? ?Needle:  ?Needle type: Tuohy  ?Needle gauge: 17 G ?Needle length: 9 cm and 9 ?Needle insertion depth: 6 cm ?Catheter type: closed end flexible ?Catheter size: 19 Gauge ?Catheter at skin depth: 10 cm ?Test dose: negative and 1.5% lidocaine with Epi 1:200 K ? ?Assessment ?Sensory level: T10 ?Events: blood not aspirated, injection not painful, no injection resistance, no paresthesia and negative IV test ? ?Additional Notes ?1 attempt ?Pt. Evaluated and documentation done after procedure finished. ?Patient identified. Risks/Benefits/Options discussed with patient including but not limited to bleeding, infection, nerve damage, paralysis, failed block, incomplete pain control, headache, blood pressure changes, nausea, vomiting, reactions to medication both or allergic, itching and postpartum back pain. Confirmed with bedside nurse the patient's most recent platelet count. Confirmed with patient that they are not currently taking any anticoagulation, have any bleeding history or any family history of bleeding disorders. Patient expressed understanding and wished to proceed. All questions were answered. Sterile technique was used throughout the entire procedure. Please see nursing notes for vital signs. Test dose was given through epidural catheter and negative prior to continuing to dose epidural or  start infusion. Warning signs of high block given to the patient including shortness of breath, tingling/numbness in hands, complete motor block, or any concerning symptoms with instructions to call for help. Patient was given instructions on fall risk and not to get out of bed. All questions and concerns addressed with instructions to call with any issues or inadequate analgesia.   ? ?Patient tolerated the insertion well without immediate complications.Reason for block:procedure for pain ? ? ? ?

## 2021-08-16 NOTE — H&P (Signed)
OB History & Physical  ? ?History of Present Illness:  ?Chief Complaint: induction ? ?HPI:  ?Kim Lynn is a 26 y.o. G1P0 female at [redacted]w[redacted]d dated by LMP of 11/09/20, c/w early [place] at [redacted]w[redacted]d.  ?Scheduled for induction of labor for Chronic respiratory issue( persistent asthma, and Covid ? ?S/p 1 dose of cytotec and Cook Cath placed by [employer] around 0730.  ?Currently reports active FM, painful UCs- has had stadol about an hour ago for pain which helped some. Denies LOF or VB.  ?  ? ?Pregnancy Issues: ?1.Asthma with COVID infx 07/01/21 ?2.Maternal mental health diagnosis  ?3. Varicella non-immune ?4.Elevated 1hr GTT ? ? ?Maternal Medical History:  ? ?Past Medical History:  ?Diagnosis Date  ? Anxiety   ? Sullivan Lone syndrome   ? ? ?Past Surgical History:  ?Procedure Laterality Date  ? KNEE ARTHROSCOPY WITH ANTERIOR CRUCIATE LIGAMENT (ACL) REPAIR    ? TONSILLECTOMY    ? ? ?Allergies  ?Allergen Reactions  ? Aspirin Other (See Comments)  ?  Liver disorder  ? Tylenol [Acetaminophen] Other (See Comments)  ?  Liver disorder  ? ? ?Prior to Admission medications   ?Medication Sig Start Date End Date Taking? Authorizing Provider  ?Prenatal Vit-Fe Fumarate-FA (MULTIVITAMIN-PRENATAL) 27-0.8 MG TABS tablet Take 1 tablet by mouth daily at 12 noon.   Yes [provider]  ?metoCLOPramide (REGLAN) 5 MG tablet Take 1 tablet (5 mg total) by mouth every 8 (eight) hours as needed for nausea or vomiting. ?Patient not taking: Reported on 08/16/2021 10/20/20   Menshew, Charlesetta Ivory, PA-C  ? ? ? ?Prenatal care site: [facility]  ? ?Social History: She  reports that she has been smoking. She has never used smokeless tobacco. She reports that she does not currently use alcohol. She reports that she does not use drugs. ? ?Family History: family history is not on file.  ? ?Review of Systems: A full review of systems was performed and negative except as noted in the HPI.   ? ? ?Physical Exam:  ?Vital Signs: BP 120/75 (BP Location:  Left Arm)   Pulse (!) 112   Temp 98.2 ?F (36.8 ?C) (Oral)   Resp 18   Ht 5\' 7"  (1.702 m)   Wt 82.6 kg   LMP 11/09/2020   BMI 28.51 kg/m?  ?General: no acute distress.  ?HEENT: normocephalic, atraumatic ?Heart: regular rate & rhythm.  No murmurs/rubs/gallops ?Lungs: clear to auscultation bilaterally, normal respiratory effort ?Abdomen: soft, gravid, non-tender;  EFW: 7lbs ?Pelvic:  ? External: Normal external female genitalia ? Cervix: Dilation: 1 / Effacement (%): 60 / Station: -1  ?  ?Extremities: non-tender, symmetric, No edema bilaterally.  DTRs: 2+  ?Neurologic: Alert & oriented x 3.   ? ?Results for orders placed or performed during the hospital encounter of 08/16/21 (from the past 24 hour(s))  ?CBC     Status: Abnormal  ? Collection Time: 08/16/21 12:41 AM  ?Result Value Ref Range  ? WBC 12.7 (H) 4.0 - 10.5 K/uL  ? RBC 4.02 3.87 - 5.11 MIL/uL  ? Hemoglobin 11.3 (L) 12.0 - 15.0 g/dL  ? HCT 34.3 (L) 36.0 - 46.0 %  ? MCV 85.3 80.0 - 100.0 fL  ? MCH 28.1 26.0 - 34.0 pg  ? MCHC 32.9 30.0 - 36.0 g/dL  ? RDW 13.8 11.5 - 15.5 %  ? Platelets 359 150 - 400 K/uL  ? nRBC 0.0 0.0 - 0.2 %  ?Type and screen  Status: None (Preliminary result)  ? Collection Time: 08/16/21 12:41 AM  ?Result Value Ref Range  ? ABO/RH(D) PENDING   ? Antibody Screen PENDING   ? Sample Expiration    ?  08/19/2021,2359 ?Performed at [facility], [address] [place], [place] [address] ?  ?Resp Panel by RT-PCR (Flu A&B, Covid) Nasopharyngeal Swab     Status: None  ? Collection Time: 08/16/21 12:41 AM  ? Specimen: Nasopharyngeal Swab; Nasopharyngeal(NP) swabs in vial transport medium  ?Result Value Ref Range  ? SARS Coronavirus 2 by RT PCR NEGATIVE NEGATIVE  ? Influenza A by PCR NEGATIVE NEGATIVE  ? Influenza B by PCR NEGATIVE NEGATIVE  ?Type and screen     Status: None  ? Collection Time: 08/16/21  2:02 AM  ?Result Value Ref Range  ? ABO/RH(D) AB POS   ? Antibody Screen NEG   ? Sample Expiration    ?   08/19/2021,2359 ?Performed at [facility], [address] [place], [place] [address] ?  ?ABO/Rh     Status: None  ? Collection Time: 08/16/21  2:04 AM  ?Result Value Ref Range  ? ABO/RH(D)    ?  AB POS ?Performed at [facility], [address], [place], [place] [address] ?  ? ? ?Pertinent Results:  ?Prenatal Labs: ?Blood type/Rh AB Pos  ?Antibody screen neg  ?Rubella Immune  ?Varicella Non Immune  ?RPR NR  ?HBsAg Neg  ?HIV NR  ?GC neg  ?Chlamydia neg  ?Genetic screening cfDNA negative  ?1 hour GTT 173  ?3 hour GTT [phone]  ?GBS Neg  ? ?FHT: 120bpm, mod variability, + accels, no decels ?TOCO: q4min, Pitocin at 51mu/min ?SVE:  Dilation: 1 / Effacement (%): 60 / Station: -1  ?  ?Cephalic by leopolds ? ?No results found. ? ?Assessment:  ?Kim Lynn is a 26 y.o. G1P0 female at [redacted]w[redacted]d with induction of labor.  ? ?Plan:  ?1. Admit to Labor & Delivery; consents reviewed and obtained ?- IOL for hx asthma with COVID infection and complications.  ?2. Fetal Well being  ?- Fetal Tracing: Cat I ?- Group B Streptococcus ppx indicated: Neg ?- Presentation: cephalic confirmed by [place] and exam  ? ?3. Routine OB: ?- Prenatal labs reviewed, as above ?- Rh AB Pos ?- CBC, T&S, RPR on admit ?- Clear fluids, IVF ? ?4. Induction of Labor ?-  Contractions: external toco in place ?-  Pelvis adequate for TOL ?-  Plan for induction with cytotec x 1 dose given, COok cath and pitocin.  ?-  Plan for continuous fetal monitoring  ?-  Maternal pain control as desired ?- Anticipate vaginal delivery ? ?5. Post Partum Planning: ?Tdap: 05/26/21 ?Flu: received antepartum through work ?Contraception: Liletta ?Feeding preference: Breast ? ?Randa Ngo, CNM ?08/16/21 ?8:34 AM ? ? ? ? ?

## 2021-08-16 NOTE — Progress Notes (Signed)
Labor Progress Note ? ?Kim Lynn is a 26 y.o. G1P0 at [redacted]w[redacted]d by LMP admitted for IOL ? ?Subjective: feeling painful UCs, but better after balloon came out.  ? ?Objective: ?BP 120/75 (BP Location: Left Arm)   Pulse (!) 112   Temp 98.2 ?F (36.8 ?C) (Oral)   Resp 18   Ht 5\' 7"  (1.702 m)   Wt 82.6 kg   LMP 11/09/2020   BMI 28.51 kg/m?  ?Notable VS details: reviewed.  ?Vitals:  ? 08/16/21 0039 08/16/21 0359 08/16/21 0709  ?BP: 118/71 124/80 120/75  ? ? ? ?Fetal Assessment: ?FHT:  FHR: 120 bpm, variability: min-mod,  accelerations:  Present,  decelerations:  Absent ?Category/reactivity:  Category I ?UC:   regular, every 2-3.5 minutes; Pitocin at 30mu/min ?SVE:   5-6/60/-1, soft/midposition.  ?- BBOW noted with UC, AROM performed, mod amount clear fluid.  ?- normal dark red bloody show.  ? ?Membrane status: AROM at 1129 ?Amniotic color: clear ? ?Labs: ?Lab Results  ?Component Value Date  ? WBC 12.7 (H) 08/16/2021  ? HGB 11.3 (L) 08/16/2021  ? HCT 34.3 (L) 08/16/2021  ? MCV 85.3 08/16/2021  ? PLT 359 08/16/2021  ? ? ?Assessment / Plan: ?G1 at [redacted]w[redacted]d with IOL due to asthma with complications d/t COVID.  ? ?Labor: Progressing normally ?Preeclampsia:  no signs or symptoms of toxicity ?Fetal Wellbeing:  Category I ?Pain Control:  Epidural and IV pain meds, epidural planned.  ?I/D:  n/a ?Anticipated MOD:  NSVD ? ?[redacted]w[redacted]d, CNM ?08/16/2021, 11:52 AM ? ? ? ? ? ? ? ? ?

## 2021-08-16 NOTE — Discharge Summary (Signed)
Obstetrical Discharge Summary ? ?Patient Name: Kim Lynn ?DOB: 06-May-1996 ?MRN: [id number] ? ?Date of Admission: 08/16/2021 ?Date of Delivery: 08/16/21 ?Delivered by: McVey CNM ?Date of Discharge: 08/18/2021 ? ?Primary OB: [facility]  ?[id number] last menstrual period was 11/09/2020. ?EDC Estimated Date of Delivery: 08/16/21 ?Gestational Age at Delivery: [redacted]w[redacted]d  ? ?Antepartum complications:  ?1.Asthma with COVID infx 07/01/21 ?2.Maternal mental health diagnosis  ?3. Varicella non-immune ?4.Elevated 1hr GTT ? ?Admitting Diagnosis: Induction, 40wks ?Secondary Diagnosis: SVD, 2nd deg lac ? ?Patient Active Problem List  ? Diagnosis Date Noted  ? Encounter for planned induction of labor 08/16/2021  ? ? ?Augmentation: AROM, Pitocin, Cytotec, and Cook Cath ?Complications: None ?Intrapartum complications/course: see delivery note ?Date of Delivery: 08/16/21 ?Delivered By: Magda Kiel CNM ?Delivery Type: spontaneous vaginal delivery ?Anesthesia: epidural ?Placenta: spontaneous ?Laceration: 2nd deg lac, right labia ?Episiotomy: none ?Newborn Data: ?Live born female "Kim Lynn"  ?Birth Weight: 7 lb 9.7 oz (3450 g) ?APGAR: 8, 9 ? ?Newborn Delivery   ?Birth date/time: 08/16/2021 18:13:00 ?Delivery type: Vaginal, Spontaneous ?  ?  ? ?Postpartum Procedures: none ? ?Edinburgh:  ?Flavia Shipper Postnatal Depression Scale Screening Tool 08/17/2021 08/17/2021 08/16/2021  ?I have been able to laugh and see the funny side of things. 0 (No Data) (No Data)  ?I have looked forward with enjoyment to things. 0 - -  ?I have blamed myself unnecessarily when things went wrong. 1 - -  ?I have been anxious or worried for no good reason. 2 - -  ?I have felt scared or panicky for no good reason. 2 - -  ?Things have been getting on top of me. 1 - -  ?I have been so unhappy that I have had difficulty sleeping. 0 - -  ?I have felt sad or miserable. 0 - -  ?I have been so unhappy that I have been crying. 0 - -  ?The thought of harming myself has occurred to me. 0 - -   ?Edinburgh Postnatal Depression Scale Total 6 - -  ?  ? ? ?Post partum course:  ?Patient had an uncomplicated postpartum course.  By time of discharge on PPD#2, her pain was controlled on oral pain medications; she had appropriate lochia and was ambulating, voiding without difficulty and tolerating regular diet.  She was deemed stable for discharge to home.   ? ? ?Discharge Physical Exam:  ?BP 110/70 (BP Location: Right Arm)   Pulse 99   Temp 98.2 ?F (36.8 ?C) (Oral)   Resp 18   Ht 5\' 7"  (1.702 m)   Wt 82.6 kg   LMP 11/09/2020   SpO2 100%   Breastfeeding Unknown   BMI 28.51 kg/m?  ? ?General: NAD ?CV: RRR ?Pulm: CTABL, nl effort ?ABD: s/nd/nt, fundus firm and below the umbilicus ?Lochia: moderate ?Perineum: well approximated/intact ?DVT Evaluation: LE non-ttp, no evidence of DVT on exam. ? ?Hemoglobin  ?Date Value Ref Range Status  ?08/17/2021 8.8 (L) 12.0 - 15.0 g/dL Final  ? ?HCT  ?Date Value Ref Range Status  ?08/17/2021 26.7 (L) 36.0 - 46.0 % Final  ? ? ? ?Disposition: stable, discharge to home. ?Baby Feeding: breastmilk and formula ?Baby Disposition: home with mom ? ?Rh Immune globulin given: n/a ?Rubella vaccine given: Immune ?Varicella vaccine given: NON-Immune, offered prior to DC ?Tdap vaccine given in AP or PP setting: 05/26/21 ?Flu vaccine given in AP or PP setting: given at work for 22-23 season ? ?Contraception:  IUD Liletta ? ?Prenatal Labs:  ?Blood type/Rh AB Pos  ?Antibody screen  neg  ?Rubella Immune  ?Varicella Non Immune  ?RPR NR  ?HBsAg Neg  ?HIV NR  ?GC neg  ?Chlamydia neg  ?Genetic screening cfDNA negative  ?1 hour GTT 173  ?3 hour GTT [phone]  ?GBS Neg  ? ? ? ?Plan:  ?PAYZLIE MCCREEDY was discharged to home in good condition. ?Follow-up appointment with [facility] in 2 weeks for postpartum mood check and with delivering provider in 6 weeks. ? ?Discharge Medications: ?Allergies as of 08/18/2021   ? ?   Reactions  ? Aspirin Other (See Comments)  ? Liver disorder  ? Tylenol [acetaminophen]  Other (See Comments)  ? Liver disorder  ? ?  ? ?  ?Medication List  ?  ? ?STOP taking these medications   ? ?metoCLOPramide 5 MG tablet ?Commonly known as: Reglan ?  ? ?  ? ?TAKE these medications   ? ?multivitamin-prenatal 27-0.8 MG Tabs tablet ?Take 1 tablet by mouth daily at 12 noon. ?  ? ?  ? ? ? Follow-up Information   ? ? McVey, Rebecca A, CNM Follow up in 6 week(s).   ?Specialty: Obstetrics and Gynecology ?Why: postpartum appointment ?Contact information: ?[address] ?[place] [address] ?[phone] ? ? ?  ?  ? ? [facility] Follow up in 2 week(s).   ?Why: 2wk Mood check with any CNM ?Contact information: ?[address] ?[place] ?[phone] ? ?  ?  ? ?  ?  ? ?  ? ? ?Signed: ? ?Minda Meo, CNM ?08/18/2021  ?9:13 AM ? ?Drinda Butts, CNM ?Certified Nurse Midwife ?[facility] ?[facility]   ? ?

## 2021-08-16 NOTE — Progress Notes (Signed)
Labor Progress Note ? ?Kim Lynn is a 26 y.o. G1P0 at [redacted]w[redacted]d by LMP admitted for IOL ? ?Subjective: comfortable ? ?Objective: ?BP 122/70   Pulse 98   Temp 98.6 ?F (37 ?C) (Oral)   Resp 18   Ht 5\' 7"  (1.702 m)   Wt 82.6 kg   LMP 11/09/2020   SpO2 94%   BMI 28.51 kg/m?  ?Notable VS details: reviewed.  ?Vitals:  ? 08/16/21 0359 08/16/21 0709 08/16/21 1153 08/16/21 1227  ?BP: 124/80 120/75 120/75 123/81  ? 08/16/21 1231 08/16/21 1233 08/16/21 1235 08/16/21 1300  ?BP: 104/65 110/71 113/68 115/76  ? 08/16/21 1430 08/16/21 1500 08/16/21 1531 08/16/21 1600  ?BP: 117/79 122/85 119/75 122/70  ? ? ? ?Fetal Assessment: ?FHT:  FHR: 125 bpm, variability: mod,  accelerations:  Present,  decelerations:  Absent ?Category/reactivity:  Category I ?UC:   regular, every 2-3 minutes; Pitocin at 20 mu/min; not tracing well, palpate mod.  ?SVE:  C/C/+2  ?- normal dark red bloody show.  ? ?Membrane status: AROM at 1129 ?Amniotic color: clear ? ?Labs: ?Lab Results  ?Component Value Date  ? WBC 12.7 (H) 08/16/2021  ? HGB 11.3 (L) 08/16/2021  ? HCT 34.3 (L) 08/16/2021  ? MCV 85.3 08/16/2021  ? PLT 359 08/16/2021  ? ? ?Assessment / Plan: ?G1 at [redacted]w[redacted]d with IOL due to asthma with complications d/t COVID.  ? ?Labor: Progressing normally, will begin pushing. Consider decreasing epidural if no progress.  ?Preeclampsia:  no signs or symptoms of toxicity ?Fetal Wellbeing:  Category I ?Pain Control:  epidural  ?I/D:  n/a ?Anticipated MOD:  NSVD ? ?[redacted]w[redacted]d, CNM ?08/16/2021, 5:32 PM ? ? ? ? ? ? ? ? ?

## 2021-08-17 LAB — CBC
HCT: 26.7 % — ABNORMAL LOW (ref 36.0–46.0)
Hemoglobin: 8.8 g/dL — ABNORMAL LOW (ref 12.0–15.0)
MCH: 28.4 pg (ref 26.0–34.0)
MCHC: 33 g/dL (ref 30.0–36.0)
MCV: 86.1 fL (ref 80.0–100.0)
Platelets: 259 10*3/uL (ref 150–400)
RBC: 3.1 MIL/uL — ABNORMAL LOW (ref 3.87–5.11)
RDW: 14 % (ref 11.5–15.5)
WBC: 15 10*3/uL — ABNORMAL HIGH (ref 4.0–10.5)
nRBC: 0 % (ref 0.0–0.2)

## 2021-08-17 MED ORDER — FERROUS SULFATE 325 (65 FE) MG PO TABS
325.0000 mg | ORAL_TABLET | Freq: Two times a day (BID) | ORAL | Status: DC
Start: 2021-08-17 — End: 2021-08-18
  Administered 2021-08-17 – 2021-08-18 (×3): 325 mg via ORAL
  Filled 2021-08-17 (×3): qty 1

## 2021-08-17 NOTE — Lactation Note (Signed)
This note was copied from a baby's chart. ?Lactation Consultation Note ? ?Patient Name: Kim Lynn ?Today's Date: 08/17/2021 ?Reason for consult: Initial assessment;Primapara;Term ?Age:24 hours ? ?Initial lactation visit for P1, SVD 15 hours ago. ? ?Maternal Data ?Does the patient have breastfeeding experience prior to this delivery?: No ? ?Mom has no previous breastfeeding experience, but has pre-pumped prior to delivery and was able to obtain colostrum. ? ?Feeding ?Mother's Current Feeding Choice: Breast Milk and Formula ?Nipple Type: Slow - flow ? ?Mom's initial choice on admittance was breastmilk, but now she is asking to pump and bottle feed with formula used for now. ? ?LATCH Score ?  ? ?  ? ?  ? ?  ? ?  ? ?  ? ? ?Lactation Tools Discussed/Used ?Tools: Pump ?Breast pump type: Double-Electric Breast Pump ?Pump Education: Setup, frequency, and cleaning;Milk Storage ?Reason for Pumping: mom's choice ?Pumping frequency: q 3 hours ? ?Mom was set-up with a pump per her request. It has been noted that baby may have a slight tongue-tie and could benefit from a nipple shield; however when speaking with mom she desires to pump and bottle feed. The pump was set-up overnight, however mom has not utilized the pump yet, and when asked she plans to start "this afternoon" because she has been so tired. ? ?LC and LC student discussed the importance of onset of milk production through frequent and consistent stimulation, building of milk supply, milk supply and demand. Mom did verbalize understanding of the process. ? ?Interventions ?Interventions: Breast feeding basics reviewed;Hand express;DEBP ? ?Discharge ?Pump: Personal ? ?Consult Status ?Consult Status: Follow-up ?Date: 08/17/21 ?Follow-up type: In-patient ? ?Encouraged mom to call when she does use the pump for the first time to ensure appropriate fit of flanges and use of the pump. ? ?Danford Bad ?08/17/2021, 9:43 AM ? ? ? ?

## 2021-08-17 NOTE — Progress Notes (Signed)
Post Partum Day 1 ?Subjective: ?Doing well, no complaints.  Tolerating regular diet, pain with PO meds, voiding and ambulating without difficulty. ? ?No CP SOB Fever,Chills, N/V or leg pain; denies nipple or breast pain, no HA change of vision, RUQ/epigastric pain ? ?Objective: ?BP 114/75 (BP Location: Left Arm)   Pulse 94   Temp 98.4 ?F (36.9 ?C)   Resp 17   Ht 5\' 7"  (1.702 m)   Wt 82.6 kg   LMP 11/09/2020   SpO2 94%   Breastfeeding Unknown   BMI 28.51 kg/m?  ?  ?Physical Exam:  ?General: NAD ?Breasts: soft/nontender ?CV: RRR ?Pulm: nl effort, CTABL ?Abdomen: soft, NT, BS x 4 ?Perineum: minimal edema, repair well approximated ?Lochia: moderate ?Uterine Fundus: fundus firm and 1 fb below umbilicus ?DVT Evaluation: no cords, ttp LEs  ? ?Recent Labs  ?  08/16/21 ?0041 08/17/21 ?10/17/21  ?HGB 11.3* 8.8*  ?HCT 34.3* 26.7*  ?WBC 12.7* 15.0*  ?PLT 359 259  ? ? ?Assessment/Plan: ?26 y.o. G1P1001 postpartum day # 1 ? ?- Continue routine PP care ?- Lactation consult ?- Social work consult for maternal anxiety ?- Discussed contraceptive options including implant, IUDs hormonal and non-hormonal, injection, pills/ring/patch, condoms, and NFP.  ?- Acute blood loss anemia - hemodynamically stable and asymptomatic; start po ferrous sulfate BID with stool softeners. She denies dizziness or SOB. ?- Immunization status: Needs Varivax prior to d/c ? ?Disposition: Does not desire Dc home today.  ? ?30, CNM ?08/17/2021 ?8:53 AM ? ? ? ? ?

## 2021-08-17 NOTE — TOC Initial Note (Signed)
Transition of Care (TOC) - Initial/Assessment Note  ? ? ?Patient Details  ?Name: Kim Lynn ?MRN: [id number] ?Date of Birth: Apr 19, 1996 ? ?Transition of Care (TOC) CM/SW Contact:    ? Cellar, RN ?Phone Number: ?08/17/2021, 9:31 AM ? ?Clinical Narrative:                 ?Spoke with patient at bedside with foB/support person asleep in chair. Patient reports she is feeling good and has no current needs or concerns. Patient reports having generalized anxiety disorder in high school however reports no active issues for the last 2 years. Discussed PPD and resources if needed. Patient reports she has a strong support system including FOB and family. Has all needed equipment including car seat. Planning to breastfeed/bottlefeed at this time and engaged with [facility] services. Has selected pediatrician and no concerns related to transportation. Will be home with infant for unknown amount of time at this time. No current toc needs or concerns.  ? ?  ?  ? ? ?Patient Goals and CMS Choice ?  ?  ?  ? ?Expected Discharge Plan and Services ?  ?  ?  ?  ?  ?                ?  ?  ?  ?  ?  ?  ?  ?  ?  ?  ? ?Prior Living Arrangements/Services ?  ?  ?  ?       ?  ?  ?  ?  ? ?Activities of Daily Living ?Home Assistive Devices/Equipment: None ?ADL Screening (condition at time of admission) ?Patient's cognitive ability adequate to safely complete daily activities?: Yes ?Is the patient deaf or have difficulty hearing?: No ?Does the patient have difficulty seeing, even when wearing glasses/contacts?: No ?Does the patient have difficulty concentrating, remembering, or making decisions?: No ?Patient able to express need for assistance with ADLs?: Yes ?Does the patient have difficulty dressing or bathing?: No ?Independently performs ADLs?: Yes (appropriate for developmental age) ?Does the patient have difficulty walking or climbing stairs?: No ?Weakness of Legs: None ?Weakness of Arms/Hands: None ? ?Permission Sought/Granted ?  ?  ?   ?   ?    ?   ? ?Emotional Assessment ?  ?  ?  ?  ?  ?  ? ?Admission diagnosis:  Encounter for planned induction of labor [Z34.90] ?Patient Active Problem List  ? Diagnosis Date Noted  ? Encounter for planned induction of labor 08/16/2021  ? ?PCP:  Pcp, No ?Pharmacy:   ?[id number] - [place], [place] - [address] AT [facility] [address] ?[address] ?[place] [address] ?Phone: [phone] Fax: [phone] ? ?[facility] [id number] [place], [place] - [address] ?[address] ?[place] [address] ?Phone: [phone] Fax: [phone] ? ? ? ? ?Social Determinants of Health (SDOH) Interventions ?  ? ?Readmission Risk Interventions ?No flowsheet data found. ? ? ?

## 2021-08-17 NOTE — Anesthesia Postprocedure Evaluation (Signed)
Anesthesia Post Note ? ?Patient: Kim Lynn ? ?Procedure(s) Performed: AN AD HOC LABOR EPIDURAL ? ?Patient location during evaluation: Mother Baby ?Anesthesia Type: Epidural ?Level of consciousness: awake and alert ?Pain management: pain level controlled ?Vital Signs Assessment: post-procedure vital signs reviewed and stable ?Respiratory status: spontaneous breathing, nonlabored ventilation and respiratory function stable ?Cardiovascular status: stable ?Postop Assessment: no headache, no backache and epidural receding ?Anesthetic complications: no ? ? ?No notable events documented. ? ? ?Last Vitals:  ?Vitals:  ? 08/16/21 2301 08/17/21 0319  ?BP: 115/67 108/71  ?Pulse: (!) 106 93  ?Resp: 18 18  ?Temp: 36.9 ?C 36.7 ?C  ?SpO2: 97% 97%  ?  ?Last Pain:  ?Vitals:  ? 08/16/21 2301  ?TempSrc: Oral  ?PainSc:   ? ? ?  ?  ?  ?  ?  ?  ? ?Rydell Wiegel B Argusta Mcgann ? ? ? ? ?

## 2021-08-17 NOTE — Lactation Note (Signed)
This note was copied from a baby's chart. ?Lactation Consultation Note ? ?Patient Name: Kim Lynn ?Today's Date: 08/17/2021 ?Reason for consult: Follow-up assessment;Primapara;Term;Difficult latch;Other (Comment) (suspected tongue tie) ?Age:24 hours ? ?Lactation follow-up per mom's request. Mom notes that pediatrician reported baby was tongue-tied and nipple shield may help. ? ?Maternal Data ?Has patient been taught Hand Expression?: Yes ?Does the patient have breastfeeding experience prior to this delivery?: No ? ?Mom has everted nipples. ? ?Feeding ?Mother's Current Feeding Choice: Breast Milk and Formula ? ?2 attempts made at the breast today per mom, along with minimal formula intake- baby remains spitty and gags when bottle close to mouth. ? ?LC performed a oral finger test. Baby does have a high arch consistent with tongue tie findings. When upper lip is pulled up there is blanching on the top gums which would appear to point to a lie tie as well. ? ?Pediatrician had mentioned to parents this morning that another provider in their office may be able to clip the tongue tie; encouraged parents to follow-up on stretches post release and ask about lip tie as this is important to maintaining latch. ? ?LATCH Score ?Latch: Repeated attempts needed to sustain latch, nipple held in mouth throughout feeding, stimulation needed to elicit sucking reflex. ? ?Audible Swallowing: None ? ?Type of Nipple: Everted at rest and after stimulation ? ?Comfort (Breast/Nipple): Soft / non-tender ? ?Hold (Positioning): Assistance needed to correctly position infant at breast and maintain latch. ? ?LATCH Score: 6 ? ?Nipple shield was provided. Mom taught how to apply the shield, positioning of the shield, and education given on the need for use. Nipple shield placed for football hold on the L breast. Baby did accept the shield, but did not suck for a feeding, nipple was held in the mouth; however, no gags or spit-up associated  with feeding attempt. ? ?Lactation Tools Discussed/Used ?Tools: Pump;Nipple Dorris Carnes ?Nipple shield size: 20 ?Breast pump type: Double-Electric Breast Pump ?Pumping frequency: q 3 hrs (has used 2x today) ? ?Pumping consistency and frequency was reiterated for the establishment of breast milk, and need to assist with milk removal due to nipple shield being a barrier for the baby, and the baby having to work harder; mom verbalized understanding. ? ?Interventions ?Interventions: Breast feeding basics reviewed;Assisted with latch;Hand express;Breast compression;Support pillows;Position options;DEBP;Education (nipple shield) ? ?Encouraged frequent feeding attempts at the breast moving forward, every 2-3 hours, skin to skin, hand expression to encourage a latch and feed. ? ?Discharge ?Pump: Personal ? ?Consult Status ?Consult Status: Follow-up ?Date: 08/18/21 ?Follow-up type: In-patient ? ? ? ?Danford Bad ?08/17/2021, 4:37 PM ? ? ? ?

## 2021-08-18 MED ORDER — VARICELLA VIRUS VACCINE LIVE 1350 PFU/0.5ML IJ SUSR
0.5000 mL | INTRAMUSCULAR | Status: AC | PRN
  Administered 2021-08-18: 0.5 mL via SUBCUTANEOUS
  Filled 2021-08-18 (×2): qty 0.5

## 2021-08-18 NOTE — Progress Notes (Signed)
Patient discharged home with family.  Discharge instructions, when to follow up, and medications reviewed with patient.  Patient verbalized understanding. Patient will be escorted out by auxiliary.   

## 2021-08-18 NOTE — Lactation Note (Signed)
This note was copied from a baby's chart. ?Lactation Consultation Note ? ?Patient Name: Kim Lynn ?Today's Date: 08/18/2021 ?  ?Age:24 hours ? ?Lactation provided known stretches for parents to perform for up to 4 weeks minimum 4x/day post frenulectomy. ?Mom mentioned attempting to feed baby soon at the breast, but then notified the RN that she planned to just bottle feed. ?Mom reiterated on the importance of frequent stimulation and milk removal to aid in increasing/building and then maintaining supply. ?Mom continued to formula feed throughout the remainder of stay. ? ? ?Danford Bad ?08/18/2021, 4:35 PM ? ? ? ?

## 2021-08-18 NOTE — Lactation Note (Signed)
This note was copied from a baby's chart. ?Lactation Consultation Note ? ?Patient Name: Kim Lynn ?Today's Date: 08/18/2021 ?Reason for consult: Maternal discharge ?Age:24 hours ? ?Maternal Data ? P1 SVD ? ?Feeding ?Mother's Current Feeding Choice: Breast Milk and Formula ?Nipple Type: Slow - flow ? ?  ? ?Lactation Tools Discussed/Used ?Nipple shield size: 16 ?Pump Education: Milk Storage ? ? ?Discharge ?Discharge Education: Engorgement and breast care;Warning signs for feeding baby;Other (comment) (Where to purchase additional NS, formula tolerance, community resources.) ? ?Mother was encouraged that baby went to breast and accepted nipple shield overnight compared to yesterday's feeding. Baby had a wide mouth and fed more efficiently at breast. Encouraged skin to skin over the next several weeks as mother is working on building milk supply, bringing baby to breast based off of feeding cues and feeding on demand. Mom asked about potential formula intolerance. Work with pediatrician if that is an issue or if there is a Event organiser, but so far baby seems to be tolerating current formula well in hospital according to voids/stools and charting. Stores like [employer] and [employer] sell NS, not sure about sizing, but suggested those two as examples. Provided community resources and outpatient support.  ? ?Consult Status ?Consult Status: Complete ?Date: 08/18/21 ?Follow-up type: Out-patient ? ? ? ?Kim Lynn ?08/18/2021, 10:21 AM ? ? ? ?

## 2021-08-18 NOTE — Discharge Instructions (Addendum)
Discharge instructions:   Call office if you have any of the following:  headache, visual changes, fever >101.0 F, chills, breast concerns (engorgement, mastitis) excessive vaginal bleeding, incision drainage or problems, leg pain or redness, depression or any other concerns.   Activity: Do not lift > 10 lbs for 6 weeks.  No intercourse or tampons for 6 weeks.  No driving for 1-2 weeks or while taking pain medication. No strenuous activity or heavy lifting for 6 weeks.  No swimming pools, hot tubs or tub baths- showers only.    It is normal to bleed for up to 6 weeks. You should not soak through more than 1 pad in 1 hour.   Continue prenatal vitamin. Increase calories and fluids while breastfeeding.  Your milk will come in, in the next couple of days (right now it is colostrum).  You may have a slight fever when your milk comes in, but it should go away on its own.   If it does not, and rises above 101 F please call the doctor.  You will also feel achy and your breasts will be firm. They will also start to leak.  If you are breastfeeding, continue as you have been and you can pump/express milk for comfort.   For concerns about your baby, please call your pediatrician For breastfeeding concerns, the lactation consultant can be reached at [phone]  Postpartum blues (feelings of happy one minute and sad another minute) are normal for the first few weeks but if it gets worse let your doctor know.    Vaginal Delivery, Care After Refer to this sheet in the next few weeks. These discharge instructions provide you with information on caring for yourself after delivery. Your caregiver may also give you specific instructions. Your treatment has been planned according to the most current medical practices available, but problems sometimes occur. Call your caregiver if you have any problems or questions after you go home. HOME CARE INSTRUCTIONS Take over-the-counter or prescription medicines  only as directed by your caregiver or pharmacist. Do not drink alcohol, especially if you are breastfeeding or taking medicine to relieve pain. Do not smoke tobacco. Continue to use good perineal care. Good perineal care includes: Wiping your perineum from back to front Keeping your perineum clean. You can do sitz baths twice a day, to help keep this area clean Do not use tampons, douche or have sex until your caregiver says it is okay. Shower only and avoid sitting in submerged water, aside from sitz baths Wear a well-fitting bra that provides breast support. Eat healthy foods. Drink enough fluids to keep your urine clear or pale yellow. Eat high-fiber foods such as whole grain cereals and breads, brown rice, beans, and fresh fruits and vegetables every day. These foods may help prevent or relieve constipation. Avoid constipation with high fiber foods or medications, such as miralax or metamucil Follow your caregiver's recommendations regarding resumption of activities such as climbing stairs, driving, lifting, exercising, or traveling. Talk to your caregiver about resuming sexual activities. Resumption of sexual activities is dependent upon your risk of infection, your rate of healing, and your comfort and desire to resume sexual activity. Try to have someone help you with your household activities and your newborn for at least a few days after you leave the hospital. Rest as much as possible. Try to rest or take a nap when your newborn is sleeping. Increase your activities gradually. Keep all of your scheduled postpartum appointments. It is very important to   keep your scheduled follow-up appointments. At these appointments, your caregiver will be checking to make sure that you are healing physically and emotionally. SEEK MEDICAL CARE IF:  You are passing large clots from your vagina. Save any clots to show your caregiver. You have a foul smelling discharge from your vagina. You have trouble  urinating. You are urinating frequently. You have pain when you urinate. You have a change in your bowel movements. You have increasing redness, pain, or swelling near your vaginal incision (episiotomy) or vaginal tear. You have pus draining from your episiotomy or vaginal tear. Your episiotomy or vaginal tear is separating. You have painful, hard, or reddened breasts. You have a severe headache. You have blurred vision or see spots. You feel sad or depressed. You have thoughts of hurting yourself or your newborn. You have questions about your care, the care of your newborn, or medicines. You are dizzy or light-headed. You have a rash. You have nausea or vomiting. You were breastfeeding and have not had a menstrual period within 12 weeks after you stopped breastfeeding. You are not breastfeeding and have not had a menstrual period by the 12th week after delivery. You have a fever. SEEK IMMEDIATE MEDICAL CARE IF:  You have persistent pain. You have chest pain. You have shortness of breath. You faint. You have leg pain. You have stomach pain. Your vaginal bleeding saturates two or more sanitary pads in 1 hour. MAKE SURE YOU:  Understand these instructions. Will watch your condition. Will get help right away if you are not doing well or get worse. Document Released: 05/26/2000 Document Revised: 10/13/2013 Document Reviewed: 01/24/2012 ExitCare Patient Information 2015 [employer]. This information is not intended to replace advice given to you by your health care provider. Make sure you discuss any questions you have with your health care provider.  Sitz Bath A sitz bath is a warm water bath taken in the sitting position. The water covers only the hips and butt (buttocks). We recommend using one that fits in the toilet, to help with ease of use and cleanliness. It may be used for either healing or cleaning purposes. Sitz baths are also used to relieve pain, itching, or muscle  tightening (spasms). The water may contain medicine. Moist heat will help you heal and relax.  HOME CARE  Take 3 to 4 sitz baths a day. Fill the bathtub half-full with warm water. Sit in the water and open the drain a little. Turn on the warm water to keep the tub half-full. Keep the water running constantly. Soak in the water for 15 to 20 minutes. After the sitz bath, pat the affected area dry. GET HELP RIGHT AWAY IF: You get worse instead of better. Stop the sitz baths if you get worse. MAKE SURE YOU: Understand these instructions. Will watch your condition. Will get help right away if you are not doing well or get worse. Document Released: 07/06/2004 Document Revised: 02/21/2012 Document Reviewed: 09/26/2010 ExitCare Patient Information 2015 [employer]. This information is not intended to replace advice given to you by your health care provider. Make sure you discuss any questions you have with your health care provider.   

## 2021-09-12 ENCOUNTER — Emergency Department: Payer: 59

## 2021-09-12 ENCOUNTER — Emergency Department
Admission: EM | Admit: 2021-09-12 | Discharge: 2021-09-12 | Discharge status: Against Medical Advice | Payer: 59 | Attending: Emergency Medicine | Admitting: Emergency Medicine

## 2021-09-12 ENCOUNTER — Other Ambulatory Visit: Payer: Self-pay

## 2021-09-12 DIAGNOSIS — Z5321 Procedure and treatment not carried out due to patient leaving prior to being seen by health care provider: Secondary | ICD-10-CM | POA: Insufficient documentation

## 2021-09-12 DIAGNOSIS — R1031 Right lower quadrant pain: Secondary | ICD-10-CM | POA: Insufficient documentation

## 2021-09-12 DIAGNOSIS — R112 Nausea with vomiting, unspecified: Secondary | ICD-10-CM | POA: Diagnosis not present

## 2021-09-12 LAB — URINALYSIS, ROUTINE W REFLEX MICROSCOPIC
Bacteria, UA: NONE SEEN
Bilirubin Urine: NEGATIVE
Glucose, UA: NEGATIVE mg/dL
Ketones, ur: NEGATIVE mg/dL
Leukocytes,Ua: NEGATIVE
Nitrite: NEGATIVE
Protein, ur: NEGATIVE mg/dL
Specific Gravity, Urine: 1.009 (ref 1.005–1.030)
pH: 8 (ref 5.0–8.0)

## 2021-09-12 LAB — COMPREHENSIVE METABOLIC PANEL WITH GFR
ALT: 72 U/L — ABNORMAL HIGH (ref 0–44)
AST: 175 U/L — ABNORMAL HIGH (ref 15–41)
Albumin: 3.8 g/dL (ref 3.5–5.0)
Alkaline Phosphatase: 104 U/L (ref 38–126)
Anion gap: 6 (ref 5–15)
BUN: 8 mg/dL (ref 6–20)
CO2: 25 mmol/L (ref 22–32)
Calcium: 8.7 mg/dL — ABNORMAL LOW (ref 8.9–10.3)
Chloride: 106 mmol/L (ref 98–111)
Creatinine, Ser: 0.79 mg/dL (ref 0.44–1.00)
GFR, Estimated: >60 mL/min
Glucose, Bld: 108 mg/dL — ABNORMAL HIGH (ref 70–99)
Potassium: 4 mmol/L (ref 3.5–5.1)
Sodium: 137 mmol/L (ref 135–145)
Total Bilirubin: 1.8 mg/dL — ABNORMAL HIGH (ref 0.3–1.2)
Total Protein: 7.3 g/dL (ref 6.5–8.1)

## 2021-09-12 LAB — CBC
HCT: 36.3 % (ref 36.0–46.0)
Hemoglobin: 11.5 g/dL — ABNORMAL LOW (ref 12.0–15.0)
MCH: 27.2 pg (ref 26.0–34.0)
MCHC: 31.7 g/dL (ref 30.0–36.0)
MCV: 85.8 fL (ref 80.0–100.0)
Platelets: 373 10*3/uL (ref 150–400)
RBC: 4.23 MIL/uL (ref 3.87–5.11)
RDW: 14.1 % (ref 11.5–15.5)
WBC: 10.5 10*3/uL (ref 4.0–10.5)
nRBC: 0 % (ref 0.0–0.2)

## 2021-09-12 LAB — LIPASE, BLOOD: Lipase: 40 U/L (ref 11–51)

## 2021-09-12 LAB — POC URINE PREG, ED: Preg Test, Ur: NEGATIVE

## 2021-09-12 MED ORDER — IOHEXOL 300 MG/ML  SOLN
80.0000 mL | Freq: Once | INTRAMUSCULAR | Status: AC | PRN
  Administered 2021-09-12: 80 mL via INTRAVENOUS

## 2021-09-12 NOTE — ED Notes (Signed)
First nurse called Clinical research associate to inform that pt not answering or seen in [place] area where she had been and that pt presumed to have left ED with IV in place. Pt called by Clinical research associate with no answer. Emergency contact, pts mother contacted and attempting to reach pt at this time.  ?

## 2021-09-12 NOTE — ED Notes (Signed)
Writer called by [place] [employer] officer to confirm wellness check of IV. Officer confirmed IV was not present. ?

## 2021-09-12 NOTE — ED Notes (Signed)
Pt called to inform writer that she went home and that she took her IV out. Writer informed that that needed to be confirmed by hospital staff, police or her nearest Emergency planning/management officer. [place] [employer] called for wellness check to ensure IV removal. PD to call writer once confirmed.  ?

## 2021-09-12 NOTE — ED Triage Notes (Signed)
Pt comes with c/o RLQ pain since this am. Pt states nausea and vomiting. Pt states 8/10 pain. ? ?Pt denies any urinary symptoms. ?

## 2021-09-12 NOTE — ED Provider Triage Note (Signed)
Emergency Medicine Provider Triage Evaluation Note ? ?Kim Lynn , a 26 y.o. female  was evaluated in triage.  Pt complains of sharp right lower quadrant pain.  Patient had sudden onset of right lower quadrant pain, nausea, vomiting this morning.  Still has her gallbladder, appendix, ovaries.  No history of ovarian cyst, no reported fevers. ? ?Review of Systems  ?Positive: Right lower quadrant pain with nausea vomiting ?Negative: Fever, chills, diarrhea, constipation, urinary changes ? ?Physical Exam  ?BP 116/81   Pulse 91   Temp 98.8 ?F (37.1 ?C) (Oral)   Resp 18   SpO2 99%  ?Gen:   Awake, no distress   ?Resp:  Normal effort  ?MSK:   Moves extremities without difficulty  ?Other:  Right lower quadrant tenderness with guarding.  Positive for rebound tenderness ? ?Medical Decision Making  ?Medically screening exam initiated at 5:29 PM.  Appropriate orders placed.  Kim Lynn was informed that the remainder of the evaluation will be completed by another provider, this initial triage assessment does not replace that evaluation, and the importance of remaining in the ED until their evaluation is complete. ? ?Patient with right lower quadrant abdominal pain.  Will order labs, CT scan at this time.  Differential includes appendicitis, colitis, ovarian cyst, fracture, ectopic pregnancy ?  ?Racheal Patches, PA-C ?09/12/21 1730 ? ?

## 2021-09-13 ENCOUNTER — Emergency Department: Payer: 59

## 2021-09-13 ENCOUNTER — Encounter
Admission: EM | Disposition: A | Discharge status: Home / Self Care | Payer: Self-pay | Source: Home / Self Care | Attending: Family Medicine

## 2021-09-13 ENCOUNTER — Observation Stay: Payer: 59 | Admitting: Anesthesiology

## 2021-09-13 ENCOUNTER — Observation Stay: Payer: 59

## 2021-09-13 ENCOUNTER — Inpatient Hospital Stay
Admission: EM | Admit: 2021-09-13 | Discharge: 2021-09-22 | DRG: 769 | Disposition: A | Discharge status: Home / Self Care | Payer: 59 | Attending: Internal Medicine | Admitting: Internal Medicine

## 2021-09-13 DIAGNOSIS — R1011 Right upper quadrant pain: Secondary | ICD-10-CM | POA: Diagnosis not present

## 2021-09-13 DIAGNOSIS — R112 Nausea with vomiting, unspecified: Secondary | ICD-10-CM

## 2021-09-13 DIAGNOSIS — O9953 Diseases of the respiratory system complicating the puerperium: Secondary | ICD-10-CM | POA: Diagnosis present

## 2021-09-13 DIAGNOSIS — D649 Anemia, unspecified: Secondary | ICD-10-CM

## 2021-09-13 DIAGNOSIS — O8883 Other embolism in the puerperium: Secondary | ICD-10-CM | POA: Diagnosis present

## 2021-09-13 DIAGNOSIS — O9963 Diseases of the digestive system complicating the puerperium: Secondary | ICD-10-CM | POA: Diagnosis not present

## 2021-09-13 DIAGNOSIS — I2699 Other pulmonary embolism without acute cor pulmonale: Secondary | ICD-10-CM

## 2021-09-13 DIAGNOSIS — F53 Postpartum depression: Secondary | ICD-10-CM | POA: Diagnosis present

## 2021-09-13 DIAGNOSIS — F32A Depression, unspecified: Secondary | ICD-10-CM | POA: Diagnosis present

## 2021-09-13 DIAGNOSIS — J45909 Unspecified asthma, uncomplicated: Secondary | ICD-10-CM | POA: Diagnosis present

## 2021-09-13 DIAGNOSIS — K8050 Calculus of bile duct without cholangitis or cholecystitis without obstruction: Secondary | ICD-10-CM

## 2021-09-13 DIAGNOSIS — K8070 Calculus of gallbladder and bile duct without cholecystitis without obstruction: Secondary | ICD-10-CM

## 2021-09-13 DIAGNOSIS — O9081 Anemia of the puerperium: Secondary | ICD-10-CM | POA: Diagnosis present

## 2021-09-13 DIAGNOSIS — E876 Hypokalemia: Secondary | ICD-10-CM | POA: Diagnosis present

## 2021-09-13 DIAGNOSIS — O99345 Other mental disorders complicating the puerperium: Secondary | ICD-10-CM | POA: Diagnosis present

## 2021-09-13 DIAGNOSIS — O99285 Endocrine, nutritional and metabolic diseases complicating the puerperium: Secondary | ICD-10-CM | POA: Diagnosis present

## 2021-09-13 DIAGNOSIS — K8040 Calculus of bile duct with cholecystitis, unspecified, without obstruction: Secondary | ICD-10-CM | POA: Diagnosis present

## 2021-09-13 HISTORY — PX: ERCP: SHX5425

## 2021-09-13 LAB — COMPREHENSIVE METABOLIC PANEL
ALT: 81 U/L — ABNORMAL HIGH (ref 0–44)
AST: 133 U/L — ABNORMAL HIGH (ref 15–41)
Albumin: 3.6 g/dL (ref 3.5–5.0)
Alkaline Phosphatase: 110 U/L (ref 38–126)
Anion gap: 9 (ref 5–15)
BUN: 8 mg/dL (ref 6–20)
CO2: 27 mmol/L (ref 22–32)
Calcium: 8.6 mg/dL — ABNORMAL LOW (ref 8.9–10.3)
Chloride: 106 mmol/L (ref 98–111)
Creatinine, Ser: 0.97 mg/dL (ref 0.44–1.00)
GFR, Estimated: >60 mL/min (ref >60)
Glucose, Bld: 128 mg/dL — ABNORMAL HIGH (ref 70–99)
Potassium: 3.6 mmol/L (ref 3.5–5.1)
Sodium: 142 mmol/L (ref 135–145)
Total Bilirubin: 2.7 mg/dL — ABNORMAL HIGH (ref 0.3–1.2)
Total Protein: 7.4 g/dL (ref 6.5–8.1)

## 2021-09-13 LAB — CBC WITH DIFFERENTIAL/PLATELET
Abs Immature Granulocytes: 0.03 10*3/uL (ref 0.00–0.07)
Basophils Absolute: 0 10*3/uL (ref 0.0–0.1)
Basophils Relative: 0 %
Eosinophils Absolute: 0.3 10*3/uL (ref 0.0–0.5)
Eosinophils Relative: 3 %
HCT: 36.2 % (ref 36.0–46.0)
Hemoglobin: 11.3 g/dL — ABNORMAL LOW (ref 12.0–15.0)
Immature Granulocytes: 0 %
Lymphocytes Relative: 12 %
Lymphs Abs: 1.2 10*3/uL (ref 0.7–4.0)
MCH: 26.9 pg (ref 26.0–34.0)
MCHC: 31.2 g/dL (ref 30.0–36.0)
MCV: 86.2 fL (ref 80.0–100.0)
Monocytes Absolute: 0.7 10*3/uL (ref 0.1–1.0)
Monocytes Relative: 7 %
Neutro Abs: 7.7 10*3/uL (ref 1.7–7.7)
Neutrophils Relative %: 78 %
Platelets: 360 10*3/uL (ref 150–400)
RBC: 4.2 MIL/uL (ref 3.87–5.11)
RDW: 14.3 % (ref 11.5–15.5)
WBC: 9.9 10*3/uL (ref 4.0–10.5)
nRBC: 0 % (ref 0.0–0.2)

## 2021-09-13 LAB — LIPASE, BLOOD: Lipase: 50 U/L (ref 11–51)

## 2021-09-13 SURGERY — ERCP, WITH INTERVENTION IF INDICATED
Anesthesia: General

## 2021-09-13 MED ORDER — SUCCINYLCHOLINE CHLORIDE 200 MG/10ML IV SOSY
PREFILLED_SYRINGE | INTRAVENOUS | Status: DC | PRN
  Administered 2021-09-13: 100 mg via INTRAVENOUS

## 2021-09-13 MED ORDER — DEXMEDETOMIDINE (PRECEDEX) IN NS 20 MCG/5ML (4 MCG/ML) IV SYRINGE
PREFILLED_SYRINGE | INTRAVENOUS | Status: DC | PRN
  Administered 2021-09-13: 8 ug via INTRAVENOUS

## 2021-09-13 MED ORDER — EPHEDRINE 5 MG/ML INJ
INTRAVENOUS | Status: AC
  Filled 2021-09-13: qty 5

## 2021-09-13 MED ORDER — PROPOFOL 10 MG/ML IV BOLUS
INTRAVENOUS | Status: DC | PRN
  Administered 2021-09-13: 100 mg via INTRAVENOUS
  Administered 2021-09-13: 50 mg via INTRAVENOUS

## 2021-09-13 MED ORDER — LIDOCAINE HCL (CARDIAC) PF 50 MG/5ML IV SOSY
PREFILLED_SYRINGE | INTRAVENOUS | Status: DC | PRN
  Administered 2021-09-13: 60 mg via INTRAVENOUS

## 2021-09-13 MED ORDER — MORPHINE SULFATE (PF) 2 MG/ML IV SOLN
2.0000 mg | INTRAVENOUS | Status: DC | PRN
  Administered 2021-09-13 – 2021-09-14 (×5): 2 mg via INTRAVENOUS
  Filled 2021-09-13 (×5): qty 1

## 2021-09-13 MED ORDER — SUCCINYLCHOLINE CHLORIDE 200 MG/10ML IV SOSY
PREFILLED_SYRINGE | INTRAVENOUS | Status: AC
  Filled 2021-09-13: qty 10

## 2021-09-13 MED ORDER — ONDANSETRON HCL 4 MG/2ML IJ SOLN
4.0000 mg | Freq: Four times a day (QID) | INTRAMUSCULAR | Status: DC | PRN
  Administered 2021-09-14 – 2021-09-19 (×7): 4 mg via INTRAVENOUS
  Filled 2021-09-13 (×6): qty 2

## 2021-09-13 MED ORDER — INDOCYANINE GREEN 25 MG IV SOLR
2.5000 mg | INTRAVENOUS | Status: AC
  Administered 2021-09-14: 2.5 mg via INTRAVENOUS
  Filled 2021-09-13: qty 1

## 2021-09-13 MED ORDER — MORPHINE SULFATE (PF) 2 MG/ML IV SOLN
2.0000 mg | Freq: Once | INTRAVENOUS | Status: AC
  Administered 2021-09-13: 2 mg via INTRAVENOUS
  Filled 2021-09-13: qty 1

## 2021-09-13 MED ORDER — FENTANYL CITRATE (PF) 100 MCG/2ML IJ SOLN
INTRAMUSCULAR | Status: AC
  Filled 2021-09-13: qty 2

## 2021-09-13 MED ORDER — ONDANSETRON HCL 4 MG PO TABS
4.0000 mg | ORAL_TABLET | Freq: Four times a day (QID) | ORAL | Status: DC | PRN
  Administered 2021-09-15 – 2021-09-21 (×7): 4 mg via ORAL
  Filled 2021-09-13 (×8): qty 1

## 2021-09-13 MED ORDER — GLYCOPYRROLATE 0.2 MG/ML IJ SOLN
INTRAMUSCULAR | Status: AC
  Filled 2021-09-13: qty 1

## 2021-09-13 MED ORDER — LACTATED RINGERS IV SOLN
INTRAVENOUS | Status: DC
Start: 2021-09-13 — End: 2021-09-13

## 2021-09-13 MED ORDER — SODIUM CHLORIDE 0.9 % IV BOLUS
1000.0000 mL | Freq: Once | INTRAVENOUS | Status: AC
  Administered 2021-09-13: 1000 mL via INTRAVENOUS

## 2021-09-13 MED ORDER — SODIUM CHLORIDE 0.9 % IV SOLN: INTRAVENOUS | Status: DC

## 2021-09-13 MED ORDER — INDOMETHACIN 50 MG RE SUPP
RECTAL | Status: AC
Start: 2021-09-13 — End: 2021-09-14
  Filled 2021-09-13: qty 2

## 2021-09-13 MED ORDER — MOMETASONE FURO-FORMOTEROL FUM 200-5 MCG/ACT IN AERO
2.0000 | INHALATION_SPRAY | Freq: Two times a day (BID) | RESPIRATORY_TRACT | Status: DC
  Administered 2021-09-14: 2 via RESPIRATORY_TRACT
  Filled 2021-09-13: qty 8.8

## 2021-09-13 MED ORDER — SERTRALINE HCL 25 MG PO TABS
50.0000 mg | ORAL_TABLET | Freq: Every day | ORAL | Status: DC
  Administered 2021-09-13 – 2021-09-18 (×5): 50 mg via ORAL
  Filled 2021-09-13 (×5): qty 1

## 2021-09-13 MED ORDER — CEFAZOLIN SODIUM-DEXTROSE 2-4 GM/100ML-% IV SOLN
2.0000 g | INTRAVENOUS | Status: AC
  Administered 2021-09-14: 2 g via INTRAVENOUS

## 2021-09-13 MED ORDER — ONDANSETRON HCL 4 MG/2ML IJ SOLN
4.0000 mg | Freq: Once | INTRAMUSCULAR | Status: AC
  Administered 2021-09-13: 4 mg via INTRAVENOUS
  Filled 2021-09-13: qty 2

## 2021-09-13 MED ORDER — EPHEDRINE SULFATE (PRESSORS) 50 MG/ML IJ SOLN
INTRAMUSCULAR | Status: DC | PRN
Start: 2021-09-13 — End: 2021-09-13
  Administered 2021-09-13: 15 mg via INTRAVENOUS

## 2021-09-13 NOTE — ED Notes (Signed)
Report given to endo 

## 2021-09-13 NOTE — Assessment & Plan Note (Addendum)
Admitted and underwent ERCP and then cholecystectomy.  Then persistent pain, see below ?

## 2021-09-13 NOTE — Transfer of Care (Signed)
Immediate Anesthesia Transfer of Care Note ? ?Patient: DALICIA KISNER ? ?Procedure(s) Performed: ENDOSCOPIC RETROGRADE CHOLANGIOPANCREATOGRAPHY (ERCP) ? ?Patient Location: PACU and Endoscopy Unit ? ?Anesthesia Type:General ? ?Level of Consciousness: drowsy and patient cooperative ? ?Airway & Oxygen Therapy: Patient Spontanous Breathing ? ?Post-op Assessment: Report given to RN and Post -op Vital signs reviewed and stable ? ?Post vital signs: Reviewed and stable ? ?Last Vitals:  ?Vitals Value Taken Time  ?BP 106/68 09/13/21 1241  ?Temp    ?Pulse 75 09/13/21 1241  ?Resp 12 09/13/21 1241  ?SpO2 99 % 09/13/21 1241  ? ? ?Last Pain:  ?Vitals:  ? 09/13/21 1055  ?TempSrc: Temporal  ?PainSc: 7   ?   ? ?  ? ?Complications: No notable events documented. ?

## 2021-09-13 NOTE — Consult Note (Signed)
?Date of Consultation:  09/13/2021 ? ?Requesting Physician:  Collier Bullock, MD ? ?Reason for Consultation:  Choledocholithiasis and cholelithiasis ? ?History of Present Illness: ?Kim Lynn is a 26 y.o. female admitted overnight with RUQ abdominal pain and findings of choledocholithiasis with cholelithiasis.  The patient is about 1 month post-partum and reports that during her pregnancy she had intermittent episodes of RUQ pain that would last a few hours.  She thought they were related to gas pains and did not inform her PCP or OB about it.  She reports that she started having RUQ pain again yesterday morning which did not resolve. This was also associated with nausea and vomiting.  She presented to the ED yesterday and had labwork and CT scan done.  Labs showed total bilirubin of 1.8, AST 175, ALT 72, alk phos 104.  CT scan showed mild prominence of intra and extrahepatic bile ducts but unable to see cholelithiasis or choledocholithiasis.  Unfortunately she was not see and due to the long wait time in the ED, the patient left for home.  Overnight, the patient reports that she woke up in more severe pain, also with nausea/vomiting and was brought to the ER again overnight via EMS.  She had repeat labwork showing a total bilirubin of 2.7, AST 133, ALT 81, alk phos 110.  She had an ultrasound which did reveal cholelithiasis with a distended gallbladder and a CBD of 9 mm, though no visible choledocholithiasis.  This was followed up with an MRCP which again showed cholelithiasis without cholecystitis and with choledocholithiasis.  She was admitted to the hospitalist team overnight with GI consultation and she underwent ERCP today with Dr. Allen Norris.  There was sludge in the CBD that was swept. ? ?Post-procedure, the patient reports some persistent RUQ pain, though not as severe as on presentation.  Denies any other abdominal pain and denies any nausea or vomiting.  She has not had any prior abdominal surgeries.  Her  pregnancy was without complication.  ? ?Past Medical History: ?Past Medical History:  ?Diagnosis Date  ? Anxiety   ? Rosanna Randy syndrome   ?  ? ?Past Surgical History: ?Past Surgical History:  ?Procedure Laterality Date  ? ERCP N/A 09/13/2021  ? Procedure: ENDOSCOPIC RETROGRADE CHOLANGIOPANCREATOGRAPHY (ERCP);  Surgeon: Lucilla Lame, MD;  Location: [facility] END[facility]COPY;  Service: Endoscopy;  Laterality: N/A;  ? KNEE ARTHROSCOPY WITH ANTERIOR CRUCIATE LIGAMENT (ACL) REPAIR    ? TONSILLECTOMY    ? ? ?Home Medications: ?Prior to Admission medications   ?Medication Sig Start Date End Date Taking? Authorizing Provider  ?ADVAIR DISKUS 250-50 MCG/ACT AEPB Inhale 1 puff into the lungs every 12 (twelve) hours. 07/19/21  Yes [provider]  ?sertraline (ZOLOFT) 50 MG tablet Take 50 mg by mouth daily. 08/25/21  Yes [provider]  ?Prenatal Vit-Fe Fumarate-FA (MULTIVITAMIN-PRENATAL) 27-0.8 MG TABS tablet Take 1 tablet by mouth daily at 12 noon. ?Patient not taking: Reported on 09/13/2021    [provider]  ? ? ?Allergies: ?Allergies  ?Allergen Reactions  ? Aspirin Other (See Comments)  ?  Liver disorder  ? Tylenol [Acetaminophen] Other (See Comments)  ?  Liver disorder  ? ? ?Social History: ? reports that she has been smoking. She has never used smokeless tobacco. She reports that she does not currently use alcohol. She reports that she does not use drugs.  ? ?Family History: ?History reviewed. No pertinent family history. ? ?Review of Systems: ?Review of Systems  ?Constitutional:  Negative for chills and fever.  ?  HENT:  Negative for hearing loss.   ?Respiratory:  Negative for shortness of breath.   ?Cardiovascular:  Negative for chest pain.  ?Gastrointestinal:  Positive for abdominal pain, nausea and vomiting. Negative for constipation and diarrhea.  ?Genitourinary:  Negative for dysuria.  ?Musculoskeletal:  Negative for myalgias.  ?Skin:  Negative for rash.  ?Neurological:  Negative for dizziness.   ?Psychiatric/Behavioral:  Negative for depression.   ? ?Physical Exam ?BP 105/72 (BP Location: Right Arm)   Pulse 64   Temp 98.8 ?F (37.1 ?C)   Resp 20   Ht '5\' 7"'  (1.702 m)   Wt 74.8 kg   SpO2 98%   Breastfeeding Yes   BMI 25.84 kg/m?  ?CONSTITUTIONAL: No acute distress, well nourished. ?HEENT:  Normocephalic, atraumatic, extraocular motion intact. ?NECK: Trachea is midline, and there is no jugular venous distension. ?RESPIRATORY:  Normal respiratory effort without pathologic use of accessory muscles. ?CARDIOVASCULAR: Regular rhythm and rate. ?GI: The abdomen is soft, non-distended, with mild tenderness to palpation in the RUQ.  Negative Murphy's sign.  No peritonitis.  ?MUSCULOSKELETAL:  Normal muscle strength and tone in all four extremities.  No peripheral edema or cyanosis. ?SKIN: Skin turgor is normal. There are no pathologic skin lesions.  ?NEUROLOGIC:  Motor and sensation is grossly normal.  Cranial nerves are grossly intact. ?PSYCH:  Alert and oriented to person, place and time. Affect is normal. ? ?Laboratory Analysis: ?Results for orders placed or performed during the hospital encounter of 09/13/21 (from the past 24 hour(s))  ?CBC with Differential     Status: Abnormal  ? Collection Time: 09/13/21  4:10 AM  ?Result Value Ref Range  ? WBC 9.9 4.0 - 10.5 K/uL  ? RBC 4.20 3.87 - 5.11 MIL/uL  ? Hemoglobin 11.3 (L) 12.0 - 15.0 g/dL  ? HCT 36.2 36.0 - 46.0 %  ? MCV 86.2 80.0 - 100.0 fL  ? MCH 26.9 26.0 - 34.0 pg  ? MCHC 31.2 30.0 - 36.0 g/dL  ? RDW 14.3 11.5 - 15.5 %  ? Platelets 360 150 - 400 K/uL  ? nRBC 0.0 0.0 - 0.2 %  ? Neutrophils Relative % 78 %  ? Neutro Abs 7.7 1.7 - 7.7 K/uL  ? Lymphocytes Relative 12 %  ? Lymphs Abs 1.2 0.7 - 4.0 K/uL  ? Monocytes Relative 7 %  ? Monocytes Absolute 0.7 0.1 - 1.0 K/uL  ? Eosinophils Relative 3 %  ? Eosinophils Absolute 0.3 0.0 - 0.5 K/uL  ? Basophils Relative 0 %  ? Basophils Absolute 0.0 0.0 - 0.1 K/uL  ? Immature Granulocytes 0 %  ? Abs Immature Granulocytes  0.03 0.00 - 0.07 K/uL  ?Comprehensive metabolic panel     Status: Abnormal  ? Collection Time: 09/13/21  4:10 AM  ?Result Value Ref Range  ? Sodium 142 135 - 145 mmol/L  ? Potassium 3.6 3.5 - 5.1 mmol/L  ? Chloride 106 98 - 111 mmol/L  ? CO2 27 22 - 32 mmol/L  ? Glucose, Bld 128 (H) 70 - 99 mg/dL  ? BUN 8 6 - 20 mg/dL  ? Creatinine, Ser 0.97 0.44 - 1.00 mg/dL  ? Calcium 8.6 (L) 8.9 - 10.3 mg/dL  ? Total Protein 7.4 6.5 - 8.1 g/dL  ? Albumin 3.6 3.5 - 5.0 g/dL  ? AST 133 (H) 15 - 41 U/L  ? ALT 81 (H) 0 - 44 U/L  ? Alkaline Phosphatase 110 38 - 126 U/L  ? Total Bilirubin 2.7 (H) 0.3 - 1.2  mg/dL  ? GFR, Estimated >60 >60 mL/min  ? Anion gap 9 5 - 15  ?Lipase, blood     Status: None  ? Collection Time: 09/13/21  4:10 AM  ?Result Value Ref Range  ? Lipase 50 11 - 51 U/L  ? ? ?Imaging: ?MR ABDOMEN MRCP WO CONTRAST ? ?Result Date: 09/13/2021 ?CLINICAL DATA:  26 year old female with history of cholelithiasis. Evaluate for biliary tract obstruction. EXAM: MRI ABDOMEN WITHOUT CONTRAST  (INCLUDING MRCP) TECHNIQUE: Multiplanar multisequence MR imaging of the abdomen was performed. Heavily T2-weighted images of the biliary and pancreatic ducts were obtained, and three-dimensional MRCP images were rendered by post processing. COMPARISON:  No prior abdominal MRI. CT the abdomen and pelvis 09/12/2021. Abdominal ultrasound 09/13/2021. FINDINGS: Comment: Today's study is limited for detection and characterization of visceral and/or vascular lesions by lack of IV gadolinium. Lower chest: Trace right pleural effusion lying dependently. Hepatobiliary: Mild diffuse loss of signal intensity throughout the hepatic parenchyma on out of phase dual echo images, indicative of a background of hepatic steatosis. No definite suspicious appearing hepatic lesions are noted on today's noncontrast examination. No intra or extrahepatic biliary ductal dilatation noted on MRCP images. In the distal common bile duct (coronal MRCP image 25 of series 13 and  coronal T2 weighted image 16 of series 3) there is a 2 mm filling defect, indicative of choledocholithiasis. Multiple other filling defects are noted within the gallbladder, compatible with gallstones. Gallbladder is only

## 2021-09-13 NOTE — ED Notes (Signed)
Recliner for family member taken to room. ?

## 2021-09-13 NOTE — Anesthesia Preprocedure Evaluation (Addendum)
Anesthesia Evaluation  ?Patient identified by MRN, date of birth, ID band ?Patient awake ? ? ? ?Reviewed: ?Allergy & Precautions, NPO status , Patient's Chart, lab work & pertinent test results ? ?Airway ?Mallampati: III ? ?TM Distance: >3 FB ?Neck ROM: full ? ? ? Dental ?no notable dental hx. ? ?  ?Pulmonary ?neg pulmonary ROS, Current Smoker,  ?  ?Pulmonary exam normal ? ? ? ? ? ? ? Cardiovascular ?negative cardio ROS ?Normal cardiovascular exam ? ? ?  ?Neuro/Psych ?PSYCHIATRIC DISORDERS Depression negative neurological ROS ?   ? GI/Hepatic ?GERD  Poorly Controlled,Gilbert syndrome ?Choledocholithiasis ?  ?Endo/Other  ?negative endocrine ROS ? Renal/GU ?negative Renal ROS  ?negative genitourinary ?  ?Musculoskeletal ? ? Abdominal ?Normal abdominal exam  (+)   ?Peds ? Hematology ?negative hematology ROS ?(+)   ?Anesthesia Other Findings ? ? 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 ?Past Surgical History: ?No date: KNEE ARTHROSCOPY WITH ANTERIOR CRUCIATE LIGAMENT (ACL) REPAIR ?No date: TONSILLECTOMY ? ?BMI   ? Body Mass Index: 25.84 kg/m?  ?  ? ? Reproductive/Obstetrics ?negative OB ROS ? ?  ? ? ? ? ? ? ? ? ? ? ? ? ? ?  ?  ? ? ? ? ? ? ? ?Anesthesia Physical ?Anesthesia Plan ? ?ASA: 2 ? ?Anesthesia Plan: General  ? ?Post-op Pain Management:   ? ?Induction: Intravenous and Rapid sequence ? ?PONV Risk Score and Plan: TIVA, Propofol infusion, Ondansetron and Dexamethasone ? ?Airway Management Planned: Oral ETT ? ?Additional Equipment:  ? ?Intra-op Plan:  ? ?Post-operative Plan: Extubation in OR ? ?Informed Consent: I have reviewed the patients History and Physical, chart, labs and discussed the procedure including the risks, benefits and alternatives for the proposed anesthesia with the patient or authorized representative who has indicated his/her understanding and acceptance.  ? ? ? ?Dental advisory given ? ?Plan Discussed  with: Anesthesiologist, CRNA and Surgeon ? ?Anesthesia Plan Comments:   ? ? ? ? ? ?Anesthesia Quick Evaluation ? ?

## 2021-09-13 NOTE — Anesthesia Procedure Notes (Signed)
Procedure Name: Intubation ?Date/Time: 09/13/2021 12:06 PM ?Performed by: Iran Ouch, MD ?Pre-anesthesia Checklist: Patient identified, Patient being monitored, Timeout performed, Emergency Drugs available and Suction available ?Patient Re-evaluated:Patient Re-evaluated prior to induction ?Oxygen Delivery Method: Circle system utilized ?Preoxygenation: Pre-oxygenation with 100% oxygen ?Induction Type: IV induction ?Ventilation: Mask ventilation without difficulty ?Laryngoscope Size: 3 and McGraph ?Grade View: Grade I ?Tube type: Oral ?Tube size: 7.0 mm ?Number of attempts: 1 ?Airway Equipment and Method: Stylet ?Placement Confirmation: ETT inserted through vocal cords under direct vision, positive ETCO2 and breath sounds checked- equal and bilateral ?Secured at: 21 cm ?Tube secured with: Tape ?Dental Injury: Teeth and Oropharynx as per pre-operative assessment  ? ? ? ? ?

## 2021-09-13 NOTE — Assessment & Plan Note (Addendum)
Psychiatry consult requested by Patient due to post-partum depression. ?- Continue sertraline ?- Consult Psychiatry ?

## 2021-09-13 NOTE — H&P (Signed)
?History and Physical  ? ? ?Patient: Kim Lynn [id number] DOB: October 03, 1995 ?DOA: 09/13/2021 ?DOS: the patient was seen and examined on 09/13/2021 ?PCP: Pcp, No  ?Patient coming from: Home ? ?Chief Complaint:  ?Chief Complaint  ?Patient presents with  ? Abdominal Pain  ? ?HPI: Kim Lynn is a 26 y.o. female with medical history significant for Rosanna Randy syndrome, depression, status post recent delivery about a month ago who presents to the ER for evaluation of right upper quadrant pain that started about 6 AM the day prior to her admission. ?Pain started in the right upper quadrant and was rated 8 x 10 in intensity at its worst with radiation across the anterior abdominal wall associated with nausea, vomiting and diarrhea. ?Patient had taken some Gas-X without improvement in her symptoms. ?She had similar pain when she was pregnant and assumed it was related to gas.  During that time the pain was initially intermittent but had a recent episode has been persistent for 24 hours prompting her visit to the ER. ?She denies having any fever, no chills, no yellow discoloration of her eyes or skin, no chest pain, no cough, no shortness of breath, no urinary symptoms, no blurred vision no focal deficit. ?Review of Systems: As mentioned in the history of present illness. All other systems reviewed and are negative. ?Past Medical History:  ?Diagnosis Date  ? Anxiety   ? Rosanna Randy syndrome   ? ?Past Surgical History:  ?Procedure Laterality Date  ? KNEE ARTHROSCOPY WITH ANTERIOR CRUCIATE LIGAMENT (ACL) REPAIR    ? TONSILLECTOMY    ? ?Social History:  reports that she has been smoking. She has never used smokeless tobacco. She reports that she does not currently use alcohol. She reports that she does not use drugs. ? ?Allergies  ?Allergen Reactions  ? Aspirin Other (See Comments)  ?  Liver disorder  ? Tylenol [Acetaminophen] Other (See Comments)  ?  Liver disorder  ? ? ?History reviewed. No pertinent family history. ? ?Prior  to Admission medications   ?Medication Sig Start Date End Date Taking? Authorizing Provider  ?ADVAIR DISKUS 250-50 MCG/ACT AEPB Inhale 1 puff into the lungs every 12 (twelve) hours. 07/19/21  Yes [provider]  ?sertraline (ZOLOFT) 50 MG tablet Take 50 mg by mouth daily. 08/25/21  Yes [provider]  ?Prenatal Vit-Fe Fumarate-FA (MULTIVITAMIN-PRENATAL) 27-0.8 MG TABS tablet Take 1 tablet by mouth daily at 12 noon. ?Patient not taking: Reported on 09/13/2021    [provider]  ? ? ?Physical Exam: ?Vitals:  ? 09/13/21 0253 09/13/21 0556  ?BP: 124/81 106/63  ?Pulse: 82 84  ?Resp: 18 16  ?Temp: 98.6 ?F (37 ?C)   ?TempSrc: Oral   ?SpO2: 96% 97%  ?Weight: 74.8 kg   ?Height: 5\' 7"  (1.702 m)   ? ?Physical Exam ?Vitals and nursing note reviewed.  ?Constitutional:   ?   Appearance: She is well-developed and normal weight.  ?HENT:  ?   Head: Normocephalic and atraumatic.  ?Cardiovascular:  ?   Rate and Rhythm: Normal rate and regular rhythm.  ?Abdominal:  ?   General: Abdomen is flat. Bowel sounds are normal.  ?   Palpations: Abdomen is soft.  ?   Tenderness: There is abdominal tenderness in the right upper quadrant.  ?Skin: ?   General: Skin is warm and dry.  ?Neurological:  ?   General: No focal deficit present.  ?   Mental Status: She is alert and oriented to person, place, and time.  ?  Psychiatric:     ?   Mood and Affect: Mood normal.     ?   Behavior: Behavior normal.  ? ? ?Data Reviewed: ?Relevant notes from primary care and specialist visits, past discharge summaries as available in EHR, including Care Everywhere. ?Prior diagnostic testing as pertinent to current admission diagnoses ?Updated medications and problem lists for reconciliation ?ED course, including vitals, labs, imaging, treatment and response to treatment ?Triage notes, nursing and pharmacy notes and ED provider's notes ?Notable results as noted in HPI ?Labs reviewed.  Glucose 128, calcium 8.6, AST 133, ALT 81, hemoglobin 11.3,  lipase 40 ?UA sterile ?Urine pregnancy test is negative ?CT scan of the abdomen shows no evidence of intestinal obstruction or pneumoperitoneum. ?There is no hydronephrosis. Appendix is not dilated. Enlarged fatty liver. There is mild prominence of intrahepatic and extrahepatic bile ducts. Please correlate with laboratory findings ?to rule out any significant bile duct obstruction. Gallbladder is unremarkable. ?Gallbladder ultrasound shows intrahepatic and extrahepatic biliary ectasia. No visible ductal ?stone but the distal duct is not well seen. Distended gallbladder with stones but no evidence for acute ?cholecystitis. Mildly prominent liver with steatosis. ?MRCP is positive for cholelithiasis with no evidence of acute cholecystitis at this time. ?Study is positive for choledocholithiasis with a 2 mm stone in the common bile duct distally. This is not associated with proximal intra or extrahepatic biliary ductal dilatation to indicate biliary obstruction at this time. ?Mild hepatic steatosis. Trace right pleural effusion lying dependently. ?There are no new results to review at this time. ? ?Assessment and Plan: ?* Choledocholithiasis ?Patient presents for evaluation of right upper quadrant pain.   ?Labs show transaminitis. ?Gallbladder ultrasound shows intrahepatic and extrahepatic biliary ectasia. No visible ductal stone but the distal duct is not well seen. ?Distended gallbladder with stones but no evidence for acute ?cholecystitis. ? MRCP shows cholelithiasis with no evidence ?of acute cholecystitis at this time. Study is positive for choledocholithiasis with a 2 mm stone in the common bile duct distally. This is not associated with proximal intra or extrahepatic biliary ductal dilatation to indicate biliary obstruction at this time.Mild hepatic steatosis. ?GI consult in place and plan for ERCP ?Keep patient n.p.o. ?Supportive care with IV fluids, antiemetics and pain control ? ?Depression ?Stable ?Continue  Zoloft ? ? ? ? ? Advance Care Planning:   Code Status: Full Code  ? ?Consults: Gastroenterology ? ?Family Communication: Greater than 50% of time was spent discussing patient's condition and plan of care with her at the bedside.  This.  She verbalizes understanding and agrees with the plan. ? ?Severity of Illness: ?The appropriate patient status for this patient is OBSERVATION. Observation status is judged to be reasonable and necessary in order to provide the required intensity of service to ensure the patient's safety. The patient's presenting symptoms, physical exam findings, and initial radiographic and laboratory data in the context of their medical condition is felt to place them at decreased risk for further clinical deterioration. Furthermore, it is anticipated that the patient will be medically stable for discharge from the hospital within 2 midnights of admission.  ? ?Author: ?Collier Bullock, MD ?09/13/2021 9:41 AM ? ?For on call review [email or web address].  ?

## 2021-09-13 NOTE — Op Note (Signed)
[facility] ?Gastroenterology ?Patient Name: Kim Lynn ?Procedure Date: 09/13/2021 11:59 AM ?MRN: [id number] ?Account #: [id number] ?Date of Birth: 01-13-1996 ?Admit Type: Outpatient ?Age: 26 ?Room: [facility] ENDO ROOM 4 ?Gender: Female ?Note Status: Finalized ?Instrument Name: EXALT Ercp scope ?Procedure:             ERCP ?Indications:           Common bile duct stone(s), Abnormal MRCP ?Providers:             Midge Minium MD, MD ?Medicines:             General Anesthesia ?Complications:         No immediate complications. ?Procedure:             Pre-Anesthesia Assessment: ?                       - Prior to the procedure, a History and Physical was  ?                       performed, and patient medications and allergies were  ?                       reviewed. The patient's tolerance of previous  ?                       anesthesia was also reviewed. The risks and benefits  ?                       of the procedure and the sedation options and risks  ?                       were discussed with the patient. All questions were  ?                       answered, and informed consent was obtained. Prior  ?                       Anticoagulants: The patient has taken no previous  ?                       anticoagulant or antiplatelet agents. ASA Grade  ?                       Assessment: II - A patient with mild systemic disease.  ?                       After reviewing the risks and benefits, the patient  ?                       was deemed in satisfactory condition to undergo the  ?                       procedure. ?                       After obtaining informed consent, the scope was passed  ?                       under direct vision. Throughout the procedure, the  ?  patient's blood pressure, pulse, and oxygen  ?                       saturations were monitored continuously. The [place]  ?                       [employer] D single use duodenoscope was  ?                        introduced through the mouth, and used to inject  ?                       contrast into and used to inject contrast into the  ?                       bile duct. The ERCP was accomplished without  ?                       difficulty. The patient tolerated the procedure well. ?Findings: ?     The scout film was normal. The esophagus was successfully intubated  ?     under direct vision. The scope was advanced to a normal major papilla in  ?     the descending duodenum without detailed examination of the pharynx,  ?     larynx and associated structures, and upper GI tract. The upper GI tract  ?     was grossly normal. The bile duct was deeply cannulated with the  ?     short-nosed traction sphincterotome. Contrast was injected. I personally  ?     interpreted the bile duct images. There was brisk flow of contrast  ?     through the ducts. Image quality was excellent. Contrast extended to the  ?     entire biliary tree. A wire was passed into the biliary tree. An 8 mm  ?     biliary sphincterotomy was made with a traction (standard)  ?     sphincterotome using ERBE electrocautery. There was no  ?     post-sphincterotomy bleeding. To discover objects, the biliary tree was  ?     swept with a 15 mm balloon starting at the bifurcation. Sludge was swept  ?     from the duct. ?Impression:            - A biliary sphincterotomy was performed. ?                       - The biliary tree was swept and sludge was found. ?Recommendation:        - Return patient to hospital ward for ongoing care. ?                       - Clear liquid diet. ?                       - Watch for pancreatitis, bleeding, perforation, and  ?                       cholangitis. ?Procedure Code(s):     --- Professional --- ?  [id number], Endoscopic retrograde cholangiopancreatography  ?                       (ERCP); with removal of calculi/debris from  ?                       biliary/pancreatic duct(s) ?                       [id number],  Endoscopic retrograde cholangiopancreatography  ?                       (ERCP); with sphincterotomy/papillotomy ?                       [id number], Endoscopic catheterization of the biliary  ?                       ductal system, radiological supervision and  ?                       interpretation ?Diagnosis Code(s):     --- Professional --- ?                       R93.2, Abnormal findings on diagnostic imaging of  ?                       liver and biliary tract ?CPT copyright 2019 American Medical Association. All rights reserved. ?The codes documented in this report are preliminary and upon coder review may  ?be revised to meet current compliance requirements. ?Midge Miniumarren Revia Nghiem MD, MD ?09/13/2021 12:44:39 PM ?This report has been signed electronically. ?Number of Addenda: 0 ?Note Initiated On: 09/13/2021 11:59 AM ?Estimated Blood Loss:  Estimated blood loss: none. ?     [facility] ?

## 2021-09-13 NOTE — ED Provider Notes (Signed)
----------------------------------------- ?  7:05 AM on 09/13/2021 ?----------------------------------------- ? ?Blood pressure 106/63, pulse 84, temperature 98.6 ?F (37 ?C), temperature source Oral, resp. rate 16, height 5\' 7"  (1.702 m), weight 74.8 kg, SpO2 97 %, currently breastfeeding. ? ?Assuming care from Dr. .  In short, Kim Lynn is a 26 y.o. female with a chief complaint of Abdominal Pain ?30  Refer to the original H&P for additional details. ? ?The current plan of care is to follow-up MRCP results for possible choledocholithiasis. ? ?----------------------------------------- ?7:26 AM on 09/13/2021 ?----------------------------------------- ?MRCP is concerning for choledocholithiasis but without obvious signs of obstruction.  Findings discussed with Dr. 11/13/2021 of GI, who recommends proceeding with ERCP later today.  Patient reports pain and nausea are improved on reassessment.  Case discussed with hospitalist for admission. ? ?  ?Servando Snare, MD ?09/13/21 [phone] ? ?

## 2021-09-13 NOTE — ED Notes (Signed)
Patient in MRI at this time. 

## 2021-09-13 NOTE — ED Triage Notes (Addendum)
Pt presents to ER via ems from home c/o RLQ abd pain x24 hours.  Pt states she was here yesterday for same but left before being roomed d/t feeling engorged.  Pt states pain is now worse than it was yesterday.  Pt denies previous bowel issues.  Endorses n/v/d. Pt did give vaginally 3/7.  Pt is A&O x4 at this time in NAD in triage.   ?

## 2021-09-13 NOTE — ED Provider Notes (Signed)
? ?[facility] ?Provider Note ? ? ? Event Date/Time  ? First MD Initiated Contact with Patient 09/13/21 0535   ?  (approximate) ? ? ?History  ? ?Abdominal Pain ? ? ?HPI ? ?Kim Lynn is a 26 y.o. female who is called to return to the ED for further evaluation of right-sided abdominal pain.  Patient is 1 month postpartum, breast-feeding who endorses a 1 day history of right-sided abdominal pain.  History of Gilbert's with elevated T. bili.  She was triaged in the ED yesterday with labs and CT scan which was unremarkable other than dilated biliary ducts.  Patient left without being seen due to feeling engorged and needing to pump.  She was called back for right upper quadrant abdominal ultrasound.  Endorses nausea/vomiting and some loose stools.  Denies fever, chills, chest pain, shortness of breath. ?  ? ? ?Past Medical History  ? ?Past Medical History:  ?Diagnosis Date  ? Anxiety   ? Sullivan Lone syndrome   ? ? ? ?Active Problem List  ? ?Patient Active Problem List  ? Diagnosis Date Noted  ? Encounter for planned induction of labor 08/16/2021  ? ? ? ?Past Surgical History  ? ?Past Surgical History:  ?Procedure Laterality Date  ? KNEE ARTHROSCOPY WITH ANTERIOR CRUCIATE LIGAMENT (ACL) REPAIR    ? TONSILLECTOMY    ? ? ? ?Home Medications  ? ?Prior to Admission medications   ?Medication Sig Start Date End Date Taking? Authorizing Provider  ?ADVAIR DISKUS 250-50 MCG/ACT AEPB Inhale 1 puff into the lungs every 12 (twelve) hours. 07/19/21  Yes [provider]  ?sertraline (ZOLOFT) 50 MG tablet Take 50 mg by mouth daily. 08/25/21  Yes [provider]  ?Prenatal Vit-Fe Fumarate-FA (MULTIVITAMIN-PRENATAL) 27-0.8 MG TABS tablet Take 1 tablet by mouth daily at 12 noon. ?Patient not taking: Reported on 09/13/2021    [provider]  ? ? ? ?Allergies  ?Aspirin and Tylenol [acetaminophen] ? ? ?Family History  ?History reviewed. No pertinent family history. ? ? ?Physical Exam  ?Triage  Vital Signs: ?ED Triage Vitals  ?Enc Vitals Group  ?   BP 09/13/21 0253 124/81  ?   Pulse Rate 09/13/21 0253 82  ?   Resp 09/13/21 0253 18  ?   Temp 09/13/21 0253 98.6 ?F (37 ?C)  ?   Temp Source 09/13/21 0253 Oral  ?   SpO2 09/13/21 0253 96 %  ?   Weight 09/13/21 0253 165 lb (74.8 kg)  ?   Height 09/13/21 0253 5\' 7"  (1.702 m)  ?   Head Circumference --   ?   Peak Flow --   ?   Pain Score 09/13/21 0251 8  ?   Pain Loc --   ?   Pain Edu? --   ?   Excl. in GC? --   ? ? ?Updated Vital Signs: ?BP 106/63 (BP Location: Right Arm)   Pulse 84   Temp 98.6 ?F (37 ?C) (Oral)   Resp 16   Ht 5\' 7"  (1.702 m)   Wt 74.8 kg   SpO2 97%   BMI 25.84 kg/m?  ? ? ?General: Awake, no distress.  ?CV:  RRR good peripheral perfusion.  ?Resp:  Normal effort.  CTA B. ?Abd:  Mildly tender to palpation right upper quadrant without rebound or guarding no distention.  ?Other:  No vesicles. ? ? ?ED Results / Procedures / Treatments  ?Labs ?(all labs ordered are listed, but only abnormal results are displayed) ?  Labs Reviewed  ?CBC WITH DIFFERENTIAL/PLATELET - Abnormal; Notable for the following components:  ?    Result Value  ? Hemoglobin 11.3 (*)   ? All other components within normal limits  ?COMPREHENSIVE METABOLIC PANEL - Abnormal; Notable for the following components:  ? Glucose, Bld 128 (*)   ? Calcium 8.6 (*)   ? AST 133 (*)   ? ALT 81 (*)   ? Total Bilirubin 2.7 (*)   ? All other components within normal limits  ?LIPASE, BLOOD  ? ? ? ?EKG ? ?None ? ? ?RADIOLOGY ?I have independently visualized and interpreted patient's ultrasound as well as noted the radiology interpretation: ? ?Ultrasound: Distended gallbladder with stones, no acute cholecystitis, intrahepatic and extrahepatic biliary ectasia ? ?Official radiology report(s): ?CT ABDOMEN PELVIS W CONTRAST ? ?Result Date: 09/12/2021 ?CLINICAL DATA:  Pain right lower quadrant EXAM: CT ABDOMEN AND PELVIS WITH CONTRAST TECHNIQUE: Multidetector CT imaging of the abdomen and pelvis was  performed using the standard protocol following bolus administration of intravenous contrast. RADIATION DOSE REDUCTION: This exam was performed according to the departmental dose-optimization program which includes automated exposure control, adjustment of the mA and/or kV according to patient size and/or use of iterative reconstruction technique. CONTRAST:  35mL OMNIPAQUE IOHEXOL 300 MG/ML  SOLN COMPARISON:  05/15/2009 FINDINGS: Lower chest: Unremarkable. Hepatobiliary: Liver measures 21.2 cm in length. There is fatty infiltration. Gallbladder is unremarkable. There is slight prominence of intrahepatic and extrahepatic bile ducts. Distal common bile duct in the head of the pancreas measures 9 mm. Pancreas: No focal abnormality is seen. Spleen: Unremarkable. Adrenals/Urinary Tract: Adrenals are not enlarged. There is no hydronephrosis. There are no renal or ureteral stones. Urinary bladder is unremarkable. Stomach/Bowel: Stomach is unremarkable. Small bowel loops are not dilated. Appendix is unremarkable. There is no significant focal wall thickening in colon. There is no pericolic stranding. Vascular/Lymphatic: Unremarkable. Reproductive: Unremarkable. Other: There is no ascites or pneumoperitoneum. Small umbilical and paraumbilical hernias containing fat are noted. Musculoskeletal: Unremarkable. IMPRESSION: There is no evidence of intestinal obstruction or pneumoperitoneum. There is no hydronephrosis. Appendix is not dilated. Enlarged fatty liver. There is mild prominence of intrahepatic and extrahepatic bile ducts. Please correlate with laboratory findings to rule out any significant bile duct obstruction. Gallbladder is unremarkable. Other findings as described in the body of the report. Electronically Signed   By: Ernie Avena M.D.   On: 09/12/2021 18:48  ? ?US Abdomen Limited RUQ (LIVER/GB) ? ?Result Date: 09/13/2021 ?CLINICAL DATA:  Right abdominal pain with a prominent common bile duct on CT. EXAM:  ULTRASOUND ABDOMEN LIMITED RIGHT UPPER QUADRANT COMPARISON:  CT with IV contrast yesterday. FINDINGS: Gallbladder: There is no gallbladder wall thickening or pericholecystic fluid and no positive sonographic Murphy's sign. There are stones in the gallbladder layering dependently measuring up to 7.5 mm. Gallbladder is distended, measuring up to 10 cm in length. Common bile duct: Diameter: Prominent, measuring up to 9 mm, with at least mild intrahepatic biliary dilatation also seen. There is no visible intraductal stone but the distal duct is not optimally seen due to bowel gas shadowing in the area. Liver: No focal lesion identified. Mildly enlarged liver, on CT measuring 21.2 cm in length, with mild general increased echogenicity of steatosis. Portal vein is patent on color Doppler imaging with normal direction of blood flow towards the liver. Other: None. IMPRESSION: 1. Intrahepatic and extrahepatic biliary ectasia. No visible ductal stone but the distal duct is not well seen. 2. Distended gallbladder with stones but no  evidence for acute cholecystitis. 3. Mildly prominent liver with steatosis. Electronically Signed   By: Almira BarKeith  Chesser M.D.   On: 09/13/2021 04:30   ? ? ?PROCEDURES: ? ?Critical Care performed: No ? ?Procedures ? ? ?MEDICATIONS ORDERED IN ED: ?Medications  ?sodium chloride 0.9 % bolus 1,000 mL (1,000 mLs Intravenous New Bag/Given 09/13/21 0603)  ?ondansetron [facility](ZOFRAN) injection 4 mg (4 mg Intravenous Given 09/13/21 0603)  ?morphine (PF) 2 MG/ML injection 2 mg (2 mg Intravenous Given 09/13/21 0604)  ? ? ? ?IMPRESSION / MDM / ASSESSMENT AND PLAN / ED COURSE  ?I reviewed the triage vital signs and the nursing notes. ?             ?               ?26 year old postpartum female presenting with right-sided abdominal pain. Differential diagnosis includes, but is not limited to, biliary disease (biliary colic, acute cholecystitis, cholangitis, choledocholithiasis, etc), intrathoracic causes for epigastric  abdominal pain including ACS, gastritis, duodenitis, pancreatitis, small bowel or large bowel obstruction, abdominal aortic aneurysm, hernia, and ulcer(s).  I have reviewed patient's PCP office visit from yesterday as well as the

## 2021-09-14 ENCOUNTER — Encounter: Payer: Self-pay | Admitting: Family Medicine

## 2021-09-14 ENCOUNTER — Inpatient Hospital Stay: Payer: 59 | Admitting: Anesthesiology

## 2021-09-14 ENCOUNTER — Encounter
Admission: EM | Disposition: A | Discharge status: Home / Self Care | Payer: Self-pay | Source: Home / Self Care | Attending: Family Medicine

## 2021-09-14 ENCOUNTER — Other Ambulatory Visit: Payer: Self-pay

## 2021-09-14 DIAGNOSIS — E876 Hypokalemia: Secondary | ICD-10-CM | POA: Diagnosis present

## 2021-09-14 DIAGNOSIS — R1011 Right upper quadrant pain: Secondary | ICD-10-CM | POA: Diagnosis present

## 2021-09-14 DIAGNOSIS — O99345 Other mental disorders complicating the puerperium: Secondary | ICD-10-CM | POA: Diagnosis present

## 2021-09-14 DIAGNOSIS — K8050 Calculus of bile duct without cholangitis or cholecystitis without obstruction: Secondary | ICD-10-CM | POA: Diagnosis not present

## 2021-09-14 DIAGNOSIS — F53 Postpartum depression: Secondary | ICD-10-CM | POA: Diagnosis present

## 2021-09-14 DIAGNOSIS — O9953 Diseases of the respiratory system complicating the puerperium: Secondary | ICD-10-CM | POA: Diagnosis present

## 2021-09-14 DIAGNOSIS — O9963 Diseases of the digestive system complicating the puerperium: Secondary | ICD-10-CM | POA: Diagnosis present

## 2021-09-14 DIAGNOSIS — O8883 Other embolism in the puerperium: Secondary | ICD-10-CM | POA: Diagnosis present

## 2021-09-14 DIAGNOSIS — F32A Depression, unspecified: Secondary | ICD-10-CM | POA: Diagnosis not present

## 2021-09-14 DIAGNOSIS — I2699 Other pulmonary embolism without acute cor pulmonale: Secondary | ICD-10-CM | POA: Diagnosis not present

## 2021-09-14 DIAGNOSIS — D649 Anemia, unspecified: Secondary | ICD-10-CM | POA: Diagnosis not present

## 2021-09-14 DIAGNOSIS — J45909 Unspecified asthma, uncomplicated: Secondary | ICD-10-CM

## 2021-09-14 DIAGNOSIS — R112 Nausea with vomiting, unspecified: Secondary | ICD-10-CM | POA: Diagnosis not present

## 2021-09-14 DIAGNOSIS — K8040 Calculus of bile duct with cholecystitis, unspecified, without obstruction: Secondary | ICD-10-CM | POA: Diagnosis present

## 2021-09-14 DIAGNOSIS — K8070 Calculus of gallbladder and bile duct without cholecystitis without obstruction: Secondary | ICD-10-CM | POA: Diagnosis not present

## 2021-09-14 DIAGNOSIS — O9081 Anemia of the puerperium: Secondary | ICD-10-CM | POA: Diagnosis present

## 2021-09-14 DIAGNOSIS — O99285 Endocrine, nutritional and metabolic diseases complicating the puerperium: Secondary | ICD-10-CM | POA: Diagnosis present

## 2021-09-14 LAB — COMPREHENSIVE METABOLIC PANEL
ALT: 72 U/L — ABNORMAL HIGH (ref 0–44)
AST: 55 U/L — ABNORMAL HIGH (ref 15–41)
Albumin: 2.8 g/dL — ABNORMAL LOW (ref 3.5–5.0)
Alkaline Phosphatase: 92 U/L (ref 38–126)
Anion gap: 5 (ref 5–15)
BUN: 6 mg/dL (ref 6–20)
CO2: 23 mmol/L (ref 22–32)
Calcium: 7.6 mg/dL — ABNORMAL LOW (ref 8.9–10.3)
Chloride: 107 mmol/L (ref 98–111)
Creatinine, Ser: 0.72 mg/dL (ref 0.44–1.00)
GFR, Estimated: >60 mL/min (ref >60)
Glucose, Bld: 100 mg/dL — ABNORMAL HIGH (ref 70–99)
Potassium: 3.4 mmol/L — ABNORMAL LOW (ref 3.5–5.1)
Sodium: 135 mmol/L (ref 135–145)
Total Bilirubin: 1.9 mg/dL — ABNORMAL HIGH (ref 0.3–1.2)
Total Protein: 5.5 g/dL — ABNORMAL LOW (ref 6.5–8.1)

## 2021-09-14 LAB — CBC
HCT: 30.9 % — ABNORMAL LOW (ref 36.0–46.0)
Hemoglobin: 9.9 g/dL — ABNORMAL LOW (ref 12.0–15.0)
MCH: 27.7 pg (ref 26.0–34.0)
MCHC: 32 g/dL (ref 30.0–36.0)
MCV: 86.3 fL (ref 80.0–100.0)
Platelets: 276 10*3/uL (ref 150–400)
RBC: 3.58 MIL/uL — ABNORMAL LOW (ref 3.87–5.11)
RDW: 14.4 % (ref 11.5–15.5)
WBC: 6.3 10*3/uL (ref 4.0–10.5)
nRBC: 0 % (ref 0.0–0.2)

## 2021-09-14 LAB — SURGICAL PCR SCREEN
MRSA, PCR: NEGATIVE
Staphylococcus aureus: POSITIVE — AB

## 2021-09-14 LAB — HIV ANTIBODY (ROUTINE TESTING W REFLEX): HIV Screen 4th Generation wRfx: NONREACTIVE

## 2021-09-14 LAB — LIPASE, BLOOD: Lipase: 35 U/L (ref 11–51)

## 2021-09-14 SURGERY — CHOLECYSTECTOMY, ROBOT-ASSISTED, LAPAROSCOPIC
Anesthesia: General

## 2021-09-14 MED ORDER — PHENYLEPHRINE HCL (PRESSORS) 10 MG/ML IV SOLN
INTRAVENOUS | Status: DC | PRN
  Administered 2021-09-14: 160 ug via INTRAVENOUS

## 2021-09-14 MED ORDER — 0.9 % SODIUM CHLORIDE (POUR BTL) OPTIME
TOPICAL | Status: DC | PRN
  Administered 2021-09-14: 200 mL

## 2021-09-14 MED ORDER — KETOROLAC TROMETHAMINE 30 MG/ML IJ SOLN
INTRAMUSCULAR | Status: DC | PRN
  Administered 2021-09-14: 30 mg via INTRAVENOUS

## 2021-09-14 MED ORDER — KETOROLAC TROMETHAMINE 30 MG/ML IJ SOLN
INTRAMUSCULAR | Status: AC
  Filled 2021-09-14: qty 1

## 2021-09-14 MED ORDER — SUGAMMADEX SODIUM 200 MG/2ML IV SOLN
INTRAVENOUS | Status: DC | PRN
  Administered 2021-09-14: 170 mg via INTRAVENOUS

## 2021-09-14 MED ORDER — PROPOFOL 10 MG/ML IV BOLUS
INTRAVENOUS | Status: AC
  Filled 2021-09-14: qty 20

## 2021-09-14 MED ORDER — DEXMEDETOMIDINE HCL IN NACL 200 MCG/50ML IV SOLN
INTRAVENOUS | Status: DC | PRN
  Administered 2021-09-14: 12 ug via INTRAVENOUS

## 2021-09-14 MED ORDER — ROCURONIUM BROMIDE 100 MG/10ML IV SOLN
INTRAVENOUS | Status: DC | PRN
  Administered 2021-09-14: 30 mg via INTRAVENOUS

## 2021-09-14 MED ORDER — FENTANYL CITRATE (PF) 100 MCG/2ML IJ SOLN
25.0000 ug | INTRAMUSCULAR | Status: DC | PRN
  Administered 2021-09-14: 50 ug via INTRAVENOUS

## 2021-09-14 MED ORDER — FENTANYL CITRATE (PF) 100 MCG/2ML IJ SOLN
INTRAMUSCULAR | Status: AC
  Administered 2021-09-14: 50 ug via INTRAVENOUS
  Filled 2021-09-14: qty 2

## 2021-09-14 MED ORDER — MUPIROCIN 2 % EX OINT
1.0000 "application " | TOPICAL_OINTMENT | Freq: Two times a day (BID) | CUTANEOUS | Status: AC
  Administered 2021-09-14 – 2021-09-18 (×10): 1 via NASAL
  Filled 2021-09-14: qty 22

## 2021-09-14 MED ORDER — ACETAMINOPHEN 10 MG/ML IV SOLN
INTRAVENOUS | Status: DC | PRN
  Administered 2021-09-14: 1000 mg via INTRAVENOUS

## 2021-09-14 MED ORDER — OXYCODONE HCL 5 MG PO TABS: 5.0000 mg | ORAL_TABLET | Freq: Once | ORAL | Status: AC

## 2021-09-14 MED ORDER — FENTANYL CITRATE (PF) 100 MCG/2ML IJ SOLN
INTRAMUSCULAR | Status: AC
Start: 2021-09-14 — End: ?
  Filled 2021-09-14: qty 2

## 2021-09-14 MED ORDER — MIDAZOLAM HCL 2 MG/2ML IJ SOLN
INTRAMUSCULAR | Status: DC | PRN
  Administered 2021-09-14: 2 mg via INTRAVENOUS

## 2021-09-14 MED ORDER — LIDOCAINE HCL (CARDIAC) PF 100 MG/5ML IV SOSY
PREFILLED_SYRINGE | INTRAVENOUS | Status: DC | PRN
  Administered 2021-09-14: 100 mg via INTRAVENOUS

## 2021-09-14 MED ORDER — OXYCODONE HCL 5 MG PO TABS
ORAL_TABLET | ORAL | Status: AC
  Administered 2021-09-14: 5 mg via ORAL
  Filled 2021-09-14: qty 1

## 2021-09-14 MED ORDER — ONDANSETRON HCL 4 MG/2ML IJ SOLN
INTRAMUSCULAR | Status: AC
  Administered 2021-09-14: 4 mg via INTRAVENOUS
  Filled 2021-09-14: qty 2

## 2021-09-14 MED ORDER — MIDAZOLAM HCL 2 MG/2ML IJ SOLN
INTRAMUSCULAR | Status: AC
  Filled 2021-09-14: qty 2

## 2021-09-14 MED ORDER — PROPOFOL 10 MG/ML IV BOLUS
INTRAVENOUS | Status: DC | PRN
  Administered 2021-09-14: 200 mg via INTRAVENOUS

## 2021-09-14 MED ORDER — HYDROMORPHONE HCL 1 MG/ML IJ SOLN: 0.5000 mg | INTRAMUSCULAR | Status: DC | PRN

## 2021-09-14 MED ORDER — OXYCODONE HCL 5 MG PO TABS
5.0000 mg | ORAL_TABLET | ORAL | Status: DC | PRN
  Administered 2021-09-15: 5 mg via ORAL
  Filled 2021-09-14 (×2): qty 1

## 2021-09-14 MED ORDER — BUPIVACAINE-EPINEPHRINE (PF) 0.25% -1:200000 IJ SOLN
INTRAMUSCULAR | Status: DC | PRN
  Administered 2021-09-14: 30 mL

## 2021-09-14 MED ORDER — CEFAZOLIN SODIUM-DEXTROSE 2-4 GM/100ML-% IV SOLN
INTRAVENOUS | Status: AC
Start: 2021-09-14 — End: 2021-09-14
  Filled 2021-09-14: qty 100

## 2021-09-14 MED ORDER — CHLORHEXIDINE GLUCONATE CLOTH 2 % EX PADS
6.0000 | MEDICATED_PAD | Freq: Every day | CUTANEOUS | Status: DC
  Administered 2021-09-14 – 2021-09-18 (×4): 6 via TOPICAL

## 2021-09-14 MED ORDER — FENTANYL CITRATE (PF) 100 MCG/2ML IJ SOLN
INTRAMUSCULAR | Status: DC | PRN
  Administered 2021-09-14 (×2): 50 ug via INTRAVENOUS

## 2021-09-14 MED ORDER — KETOROLAC TROMETHAMINE 30 MG/ML IJ SOLN
30.0000 mg | Freq: Four times a day (QID) | INTRAMUSCULAR | Status: DC
  Administered 2021-09-15 (×2): 30 mg via INTRAVENOUS
  Filled 2021-09-14 (×2): qty 1

## 2021-09-14 MED ORDER — DEXAMETHASONE SODIUM PHOSPHATE 10 MG/ML IJ SOLN
INTRAMUSCULAR | Status: DC | PRN
  Administered 2021-09-14: 10 mg via INTRAVENOUS

## 2021-09-14 MED ORDER — ACETAMINOPHEN 10 MG/ML IV SOLN
INTRAVENOUS | Status: AC
  Filled 2021-09-14: qty 100

## 2021-09-14 MED ORDER — SUCCINYLCHOLINE CHLORIDE 200 MG/10ML IV SOSY
PREFILLED_SYRINGE | INTRAVENOUS | Status: DC | PRN
  Administered 2021-09-14: 100 mg via INTRAVENOUS

## 2021-09-14 MED ORDER — PROMETHAZINE HCL 25 MG/ML IJ SOLN: 6.2500 mg | INTRAMUSCULAR | Status: DC | PRN

## 2021-09-14 MED ORDER — OXYCODONE HCL 5 MG PO TABS
5.0000 mg | ORAL_TABLET | Freq: Four times a day (QID) | ORAL | Status: DC | PRN
  Administered 2021-09-14 (×2): 5 mg via ORAL
  Filled 2021-09-14 (×2): qty 1

## 2021-09-14 MED ORDER — LACTATED RINGERS IV SOLN: INTRAVENOUS | Status: DC | PRN

## 2021-09-14 MED ORDER — SODIUM CHLORIDE 0.9 % IV SOLN
INTRAVENOUS | Status: DC
Start: 2021-09-14 — End: 2021-09-15

## 2021-09-14 MED ORDER — DEXMEDETOMIDINE HCL IN NACL 80 MCG/20ML IV SOLN
INTRAVENOUS | Status: AC
  Filled 2021-09-14: qty 20

## 2021-09-14 MED ORDER — BUPIVACAINE-EPINEPHRINE (PF) 0.25% -1:200000 IJ SOLN
INTRAMUSCULAR | Status: AC
Start: 2021-09-14 — End: ?
  Filled 2021-09-14: qty 30

## 2021-09-14 SURGICAL SUPPLY — 45 items
BAG RETRIEVAL 10 (BASKET) ×1
CANNULA REDUC XI 12-8 STAPL (CANNULA) ×1
CANNULA REDUCER 12-8 DVNC XI (CANNULA) ×1 IMPLANT
CLIP LIGATING HEMO O LOK GREEN (MISCELLANEOUS) ×2 IMPLANT
DERMABOND ADVANCED (GAUZE/BANDAGES/DRESSINGS) ×1
DERMABOND ADVANCED .7 DNX12 (GAUZE/BANDAGES/DRESSINGS) ×1 IMPLANT
DRAPE ARM DVNC X/XI (DISPOSABLE) ×4 IMPLANT
DRAPE COLUMN DVNC XI (DISPOSABLE) ×1 IMPLANT
DRAPE DA VINCI XI ARM (DISPOSABLE) ×4
DRAPE DA VINCI XI COLUMN (DISPOSABLE) ×1
ELECT CAUTERY BLADE TIP 2.5 (TIP) ×2
ELECT REM PT RETURN 9FT ADLT (ELECTROSURGICAL) ×2
ELECTRODE CAUTERY BLDE TIP 2.5 (TIP) ×1 IMPLANT
ELECTRODE REM PT RTRN 9FT ADLT (ELECTROSURGICAL) ×1 IMPLANT
GLOVE SURG SYN 7.0 (GLOVE) ×4 IMPLANT
GLOVE SURG SYN 7.0 PF PI (GLOVE) ×2 IMPLANT
GLOVE SURG SYN 7.5  E (GLOVE) ×2
GLOVE SURG SYN 7.5 E (GLOVE) ×2 IMPLANT
GLOVE SURG SYN 7.5 PF PI (GLOVE) ×2 IMPLANT
GOWN STRL REUS W/ TWL LRG LVL3 (GOWN DISPOSABLE) ×4 IMPLANT
GOWN STRL REUS W/TWL LRG LVL3 (GOWN DISPOSABLE) ×4
KIT PINK PAD W/HEAD ARE REST (MISCELLANEOUS) ×2 IMPLANT
KIT PINK PAD W/HEAD ARM REST (MISCELLANEOUS) ×1 IMPLANT
MANIFOLD NEPTUNE II (INSTRUMENTS) ×2 IMPLANT
NEEDLE HYPO 22GX1.5 SAFETY (NEEDLE) ×2 IMPLANT
NS IRRIG 500ML POUR BTL (IV SOLUTION) ×2 IMPLANT
OBTURATOR OPTICAL STANDARD 8MM (TROCAR) ×1
OBTURATOR OPTICAL STND 8 DVNC (TROCAR) ×1
OBTURATOR OPTICALSTD 8 DVNC (TROCAR) ×1 IMPLANT
PACK LAP CHOLECYSTECTOMY (MISCELLANEOUS) ×2 IMPLANT
PENCIL ELECTRO HAND CTR (MISCELLANEOUS) ×2 IMPLANT
SEAL CANN UNIV 5-8 DVNC XI (MISCELLANEOUS) ×3 IMPLANT
SEAL XI 5MM-8MM UNIVERSAL (MISCELLANEOUS) ×3
SET TUBE SMOKE EVAC HIGH FLOW (TUBING) ×2 IMPLANT
SOLUTION ELECTROLUBE (MISCELLANEOUS) ×2 IMPLANT
SPIKE FLUID TRANSFER (MISCELLANEOUS) ×2 IMPLANT
STAPLER CANNULA SEAL DVNC XI (STAPLE) ×1 IMPLANT
STAPLER CANNULA SEAL XI (STAPLE) ×1
SUT MNCRL AB 4-0 PS2 18 (SUTURE) ×3 IMPLANT
SUT VICRYL 0 AB UR-6 (SUTURE) ×4 IMPLANT
SYS BAG RETRIEVAL 10MM (BASKET) ×1
SYSTEM BAG RETRIEVAL 10MM (BASKET) ×1 IMPLANT
TAPE TRANSPORE STRL 2 31045 (GAUZE/BANDAGES/DRESSINGS) ×2 IMPLANT
TROCAR BALLN GELPORT 12X130M (ENDOMECHANICALS) ×2 IMPLANT
WATER STERILE IRR 500ML POUR (IV SOLUTION) ×2 IMPLANT

## 2021-09-14 NOTE — Anesthesia Postprocedure Evaluation (Signed)
Anesthesia Post Note ? ?Patient: Kim Lynn ? ?Procedure(s) Performed: ENDOSCOPIC RETROGRADE CHOLANGIOPANCREATOGRAPHY (ERCP) ? ?Patient location during evaluation: PACU ?Anesthesia Type: General ?Level of consciousness: awake and alert ?Pain management: pain level controlled ?Vital Signs Assessment: post-procedure vital signs reviewed and stable ?Respiratory status: spontaneous breathing, nonlabored ventilation and respiratory function stable ?Cardiovascular status: blood pressure returned to baseline and stable ?Postop Assessment: no apparent nausea or vomiting ?Anesthetic complications: no ? ? ?No notable events documented. ? ? ?Last Vitals:  ?Vitals:  ? 09/14/21 0410 09/14/21 0805  ?BP: 110/67 98/70  ?Pulse: 70 63  ?Resp: 20 18  ?Temp: 36.7 ?C 36.9 ?C  ?SpO2: 99% 97%  ?  ?Last Pain:  ?Vitals:  ? 09/14/21 1414  ?TempSrc:   ?PainSc: 7   ? ? ?  ?  ?  ?  ?  ?  ? ?[place] Ouch ? ? ? ? ?

## 2021-09-14 NOTE — Hospital Course (Addendum)
Kim Lynn is a 26 y.o. F with asthma, recent pregnancy who presented with 1 day RUQ pain and nausea. ? ?In the ER, LFTs elevated, imaging with CT and US showed dilated bile ducts.  MRCP confirmed small stone.   ? ? ?4/4: Admitted and taken for ERCP by Dr. Servando Snare for sphincterotomy, sludge in duct ?4/5: Cholecystectomy ?4/6-4/8: Persistent vomiting, inability to eat, CT angiogram shows small PE ?4/9: Psychiatry consulted, no risk of harm to self ?4/11: HIDA performed, no bile leak, GI consulted for assistance with nausea ?

## 2021-09-14 NOTE — Assessment & Plan Note (Signed)
No evidence of flare ?-Continue ICS/LAMA ? ?

## 2021-09-14 NOTE — Progress Notes (Signed)
Mobility Specialist - Progress Note ? ? 09/14/21 1400  ?Mobility  ?Activity Refused mobility  ? ?Pt supine upon arrival using RA with family at bedside. Pt refuses ambulation d/t upcoming surgery. Will attempt at another date and time. ? ?Clarisa Schools ?Mobility Specialist ?09/14/21, 2:14 PM ? ? ? ?

## 2021-09-14 NOTE — Anesthesia Procedure Notes (Signed)
Procedure Name: Intubation ?Date/Time: 09/14/2021 6:35 PM ?Performed by: Loletha Grayer, CRNA ?Pre-anesthesia Checklist: Patient identified, Patient being monitored, Timeout performed, Emergency Drugs available and Suction available ?Patient Re-evaluated:Patient Re-evaluated prior to induction ?Oxygen Delivery Method: Circle system utilized ?Preoxygenation: Pre-oxygenation with 100% oxygen ?Induction Type: IV induction, Rapid sequence and Cricoid Pressure applied ?Laryngoscope Size: 3 and McGraph ?Grade View: Grade I ?Tube type: Oral ?Tube size: 7.0 mm ?Number of attempts: 1 ?Airway Equipment and Method: Stylet ?Placement Confirmation: ETT inserted through vocal cords under direct vision, positive ETCO2 and breath sounds checked- equal and bilateral ?Secured at: 20 cm ?Tube secured with: Tape ?Dental Injury: Teeth and Oropharynx as per pre-operative assessment  ? ? ? ? ?

## 2021-09-14 NOTE — Op Note (Signed)
?  Procedure Date:  09/14/2021 ? ?Pre-operative Diagnosis:  Choledocholithiasis with cholelithiasis ? ?Post-operative Diagnosis: Choledocholithiasis and cholecystitis ? ?Procedure:  Robotic assisted cholecystectomy with ICG FireFly cholangiogram ? ?Surgeon:  Howie Ill, MD ? ?Anesthesia:  General endotracheal ? ?Estimated Blood Loss:  15 ml ? ?Specimens:  gallbladder ? ?Complications:  None ? ?Indications for Procedure:  This is a 26 y.o. female who presents with abdominal pain and workup revealing choledocholithiasis with cholelithiasis.  She underwent ERCP and is now ready for cholecystectomy.  The benefits, complications, treatment options, and expected outcomes were discussed with the patient. The risks of bleeding, infection, recurrence of symptoms, failure to resolve symptoms, bile duct damage, bile duct leak, retained common bile duct stone, bowel injury, and need for further procedures were all discussed with the patient and she was willing to proceed. ? ?Description of Procedure: ?The patient was correctly identified in the preoperative area and brought into the operating room.  The patient was placed supine with VTE prophylaxis in place.  Appropriate time-outs were performed.  Anesthesia was induced and the patient was intubated.  Appropriate antibiotics were infused. ? ?The abdomen was prepped and draped in a sterile fashion. An infraumbilical incision was made. A cutdown technique was used to enter the abdominal cavity without injury, and a 12 mm robotic port was inserted.  Pneumoperitoneum was obtained with appropriate opening pressures.  Three 8-mm ports were placed in the mid abdomen at the level of the umbilicus under direct visualization.  The DaVinci platform was docked, camera targeted, and instruments were placed under direct visualization. ? ?The gallbladder was identified and found to be a bit inflamed.  The fundus was grasped and retracted cephalad.  Adhesions were lysed bluntly and with  electrocautery. The infundibulum was grasped and retracted laterally, exposing the peritoneum overlying the gallbladder.  This was incised with electrocautery and extended on either side of the gallbladder.  FireFly cholangiogram was then obtained, and we were able to clearly identify the cystic duct and common bile duct.  The cystic duct and cystic artery were carefully dissected with combination of cautery and blunt dissection.  Both were clipped twice proximally and once distally, cutting in between.  The gallbladder was taken from the gallbladder fossa in a retrograde fashion with electrocautery. The gallbladder was placed in an Endocatch bag. The liver bed was inspected and any bleeding was controlled with electrocautery. The right upper quadrant was then inspected again revealing intact clips, no bleeding, and no ductal injury.  The area was thoroughly irrigated. ? ?The 8 mm ports were removed under direct visualization and the 12 mm port was removed.  The Endocatch bag was brought out via the umbilical incision. The fascial opening was closed using 0 vicryl suture.  Local anesthetic was infused in all incisions and the incisions were closed with 4-0 Monocryl.  The wounds were cleaned and sealed with DermaBond. ? ?The patient was emerged from anesthesia and extubated and brought to the recovery room for further management. ? ?The patient tolerated the procedure well and all counts were correct at the end of the case. ? ? ?Howie Ill, MD ? ? ?  ?

## 2021-09-14 NOTE — Progress Notes (Signed)
[facility] ?SURGICAL PROGRESS NOTE ? ?Hospital Day(s): 0.  ? ?Post op day(s): 1 Day Post-Op.  ? ?Interval History:  ?Patient seen and examined ?No acute events or new complaints overnight.  ?Patient reports she is overall doing well; still sore in RUQ but improved comparatively ?No fever, chills, nausea, emesis ?She remains without leukocytosis; 6.3K ?Hgb to 9.9; suspect this is dilutional  ?Renal function normal; sCr - 0.72; UO - unmeasured ?Mild hypokalemia to 3.4 ?Total bilirubin improving; now 1.9 ?ERCp yesterday 04/04 ?She is NPO this morning ?Plan for robotic assisted laparoscopic cholecystectomy this afternoon  ? ?Vital signs in last 24 hours: [min-max] current  ?Temp:  [97.6 ?F (36.4 ?C)-98.8 ?F (37.1 ?C)] 98.4 ?F (36.9 ?C) (04/05 0805) ?Pulse Rate:  [57-75] 63 (04/05 0805) ?Resp:  [12-20] 18 (04/05 0805) ?BP: (98-120)/(67-86) 98/70 (04/05 0805) ?SpO2:  [94 %-100 %] 97 % (04/05 0805) ?Weight:  [74.8 kg] 74.8 kg (04/04 1055)     Height: 5\' 7"  (170.2 cm) Weight: 74.8 kg BMI (Calculated): 25.84  ? ?Intake/Output last 2 shifts:  ?04/04 0701 - 04/05 0700 ?In: 1276.7 [I.V.:276.7; IV Piggyback:1000] ?Out: -   ? ?Physical Exam:  ?Constitutional: alert, cooperative and no distress  ?Respiratory: breathing non-labored at rest  ?Cardiovascular: regular rate and sinus rhythm  ?Gastrointestinal: soft, RUQ soreness and non-distended ?Integumentary: warm, dry ? ?Labs:  ? ?  Latest Ref Rng & Units 09/14/2021  ?  5:40 AM 09/13/2021  ?  4:10 AM 09/12/2021  ?  5:23 PM  ?CBC  ?WBC 4.0 - 10.5 K/uL 6.3   9.9   10.5    ?Hemoglobin 12.0 - 15.0 g/dL 9.9   11/12/2021   86.7    ?Hematocrit 36.0 - 46.0 % 30.9   36.2   36.3    ?Platelets 150 - 400 K/uL 276   360   373    ? ? ?  Latest Ref Rng & Units 09/14/2021  ?  5:40 AM 09/13/2021  ?  4:10 AM 09/12/2021  ?  5:23 PM  ?CMP  ?Glucose 70 - 99 mg/dL 11/12/2021   509   326    ?BUN 6 - 20 mg/dL 6   8   8     ?Creatinine 0.44 - 1.00 mg/dL 712     4.58    ?Sodium 135 - 145 mmol/L 135   142    137    ?Potassium 3.5 - 5.1 mmol/L 3.4   3.6   4.0    ?Chloride 98 - 111 mmol/L 107   106   106    ?CO2 22 - 32 mmol/L 23   27   25     ?Calcium 8.9 - 10.3 mg/dL 7.6   8.6   8.7    ?Total Protein 6.5 - 8.1 g/dL 5.5   7.4   7.3    ?Total Bilirubin 0.3 - 1.2 mg/dL 1.9   2.7   1.8    ?Alkaline Phos 38 - 126 U/L 92   110   104    ?AST 15 - 41 U/L 55   133   175    ?ALT 0 - 44 U/L 72   81   72    ? ? ?Imaging studies: No new pertinent imaging studies ? ? ?Assessment/Plan:  ?26 y.o. female with improving hyperbilirubinemia admitted with choledocholithiasis 1 day s/p ERCP ? ? - Appreciate GI assistance; ERCP reviewed ? - Plan for robotic assisted laparoscopic cholecystectomy this afternoon with Dr 8.33 pending  OR/Anesthesia availability ? - All risks, benefits, and alternatives to above procedure(s) were discussed with the patient, all of her questions were answered to her expressed satisfaction, patient expresses she wishes to proceed, and informed consent was obtained.  ? - NPO for procedure; IVF resuscitation ? - Abx on call to OR ?- Monitor abdominal examination   ? - Pain control prn; antiemetics prn  ? - Monitor hyperbilirubinemia ?- Further management per primary service; we will follow   ? ?All of the above findings and recommendations were discussed with the patient, and the medical team, and all of patient's questions were answered to her expressed satisfaction. ? ?-- ?Lynden Oxford, PA-C ?[facility] ?09/14/2021, 8:16 AM ?[phone] ?M-F: 7am - 4pm ? ?

## 2021-09-14 NOTE — Progress Notes (Signed)
?  Progress Note ? ? ?Patient: Kim Lynn QDI:[id number] DOB: 08-Feb-1996 DOA: 09/13/2021     0 ?DOS: the patient was seen and examined on 09/14/2021 ?  ?Brief hospital course: ?Kim Lynn is a 26 y.o. F with asthma, recent pregnancy who presented with 1 day RUQ pain and nausea. ? ?In the ER, LFTs elevated, imaging with CT and US showed dilated bile ducts.  MRCP confirmed small stone.   ? ? ?4/4: Admitted and taken for ERCP by Dr. Servando Snare for sphincterotomy, sludge in duct ?4/5: Plan for cholecystectomy today ? ?Assessment and Plan: ?* Choledocholithiasis ?- N.p.o. ?- IV fluids ?- Antiemetics and analgesics as needed ?- Consult general surgery, anticipate cholecystectomy this afternoon ? ?Normocytic anemia ?Likely expected after delivery  ?- Follow-up with PCP ? ?Asthma, chronic ?No evidence of flare ?-Continue ICS/LAMA ? ? ?Depression ?- Continue sertraline ? ? ? ? ?  ? ?Subjective: Patient continues to have some right upper quadrant pain, this improves with oxycodone.  She had no fever, vomiting, jaundice, confusion. ? ?Physical Exam: ?Vitals:  ? 09/13/21 1941 09/14/21 0410 09/14/21 0805 09/14/21 1601  ?BP: 105/72 110/67 98/70 105/68  ?Pulse: 64 70 63 66  ?Resp: 20 20 18 18   ?Temp: 98.8 ?F (37.1 ?C) 98 ?F (36.7 ?C) 98.4 ?F (36.9 ?C) 98.3 ?F (36.8 ?C)  ?TempSrc:   Oral Oral  ?SpO2: 98% 99% 97% 97%  ?Weight:      ?Height:      ? ?Adult female, lying in bed, sleepy, interactive but appropriate. ?RRR, no murmurs, no lower extremity edema ?Respiratory rate normal, lungs clear without rales or wheezes ?Abdomen some mild tenderness in the right upper quadrant, no rigidity, no guarding, no distention. ?Attention normal, affect normal, judgment insight appear normal. ? ? ? ? ?Data Reviewed: ?Nursing notes reviewed, general surgery notes reviewed, GI notes reviewed.  Operative reports reviewed. ?LFTs have dropped by half, lipase normal. ?Electrolytes and renal function normal. ? ? ? ? ? ?Family Communication:   ? ?Disposition: ?Status is: Inpatient ?The patient presented with choledocholithiasis, she had sphincterotomy with ERCP, and there is plans for cholecystectomy today. ? ?We will observe for signs of cholangitis or pancreatitis.  If she is improving tomorrow, able to have a cholecystectomy advance her diet without symptoms we will likely discharge tomorrow ? ? ? ? ? ? Planned Discharge Destination: Home ? ? ? ? ?Author: ? , MD ?09/14/2021 4:10 PM ? ?For on call review [email or web address].  ?

## 2021-09-14 NOTE — Anesthesia Preprocedure Evaluation (Signed)
Anesthesia Evaluation  ?Patient identified by MRN, date of birth, ID band ?Patient awake ? ? ? ?Reviewed: ?Allergy & Precautions, NPO status , Patient's Chart, lab work & pertinent test results ? ?History of Anesthesia Complications ?Negative for: history of anesthetic complications ? ?Airway ?Mallampati: III ? ?TM Distance: >3 FB ?Neck ROM: full ? ? ? Dental ?no notable dental hx. ? ?  ?Pulmonary ?neg shortness of breath, asthma , neg recent URI, Current Smoker,  ?  ?Pulmonary exam normal ? ? ? ? ? ? ? Cardiovascular ?negative cardio ROS ?Normal cardiovascular exam ? ? ?  ?Neuro/Psych ?PSYCHIATRIC DISORDERS Anxiety Depression negative neurological ROS ?   ? GI/Hepatic ?GERD  Poorly Controlled,Gilbert syndrome ?Choledocholithiasis ?  ?Endo/Other  ?negative endocrine ROS ? Renal/GU ?negative Renal ROS  ?negative genitourinary ?  ?Musculoskeletal ? ? Abdominal ?Normal abdominal exam  (+)   ?Peds ? Hematology ?negative hematology ROS ?(+)   ?Anesthesia Other Findings ? ? 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 ?Past Surgical History: ?No date: KNEE ARTHROSCOPY WITH ANTERIOR CRUCIATE LIGAMENT (ACL) REPAIR ?No date: TONSILLECTOMY ? ?BMI   ? Body Mass Index: 25.84 kg/m?  ?  ? ? Reproductive/Obstetrics ?negative OB ROS ? ?  ? ? ? ? ? ? ? ? ? ? ? ? ? ?  ?  ? ? ? ? ? ? ? ? ?Anesthesia Physical ? ?Anesthesia Plan ? ?ASA: 2 ? ?Anesthesia Plan: General  ? ?Post-op Pain Management:   ? ?Induction: Intravenous ? ?PONV Risk Score and Plan: Ondansetron, Dexamethasone, Midazolam, Treatment may vary due to age or medical condition and Promethazine ? ?Airway Management Planned: Oral ETT ? ?Additional Equipment:  ? ?Intra-op Plan:  ? ?Post-operative Plan: Extubation in OR ? ?Informed Consent: I have reviewed the patients History and Physical, chart, labs and discussed the procedure including the risks, benefits and alternatives for the  proposed anesthesia with the patient or authorized representative who has indicated his/her understanding and acceptance.  ? ? ? ?Dental advisory given ? ?Plan Discussed with: Anesthesiologist, CRNA and Surgeon ? ?Anesthesia Plan Comments:   ? ? ? ? ? ? ?Anesthesia Quick Evaluation ? ?

## 2021-09-14 NOTE — Transfer of Care (Signed)
Immediate Anesthesia Transfer of Care Note ? ?Patient: Kim Lynn ? ?Procedure(s) Performed: XI ROBOTIC ASSISTED LAPAROSCOPIC CHOLECYSTECTOMY ?INDOCYANINE GREEN FLUORESCENCE IMAGING (ICG) ? ?Patient Location: PACU ? ?Anesthesia Type:General ? ?Level of Consciousness: drowsy and patient cooperative ? ?Airway & Oxygen Therapy: Patient Spontanous Breathing ? ?Post-op Assessment: Report given to RN and Post -op Vital signs reviewed and stable ? ?Post vital signs: Reviewed and stable ? ?Last Vitals:  ?Vitals Value Taken Time  ?BP 112/78 09/14/21 2021  ?Temp    ?Pulse 76 09/14/21 2021  ?Resp 21 09/14/21 2021  ?SpO2 97 % 09/14/21 2021  ?Vitals shown include unvalidated device data. ? ?Last Pain:  ?Vitals:  ? 09/14/21 1657  ?TempSrc: Oral  ?PainSc: 0-No pain  ?   ? ?Patients Stated Pain Goal: 0 (09/14/21 1657) ? ?Complications: No notable events documented. ?

## 2021-09-14 NOTE — Assessment & Plan Note (Signed)
Likely expected after delivery  ?- Follow-up with PCP ?

## 2021-09-15 DIAGNOSIS — F32A Depression, unspecified: Secondary | ICD-10-CM | POA: Diagnosis not present

## 2021-09-15 DIAGNOSIS — D649 Anemia, unspecified: Secondary | ICD-10-CM | POA: Diagnosis not present

## 2021-09-15 DIAGNOSIS — K8050 Calculus of bile duct without cholangitis or cholecystitis without obstruction: Secondary | ICD-10-CM | POA: Diagnosis not present

## 2021-09-15 DIAGNOSIS — J45909 Unspecified asthma, uncomplicated: Secondary | ICD-10-CM | POA: Diagnosis not present

## 2021-09-15 LAB — COMPREHENSIVE METABOLIC PANEL
ALT: 54 U/L — ABNORMAL HIGH (ref 0–44)
AST: 31 U/L (ref 15–41)
Albumin: 3.2 g/dL — ABNORMAL LOW (ref 3.5–5.0)
Alkaline Phosphatase: 88 U/L (ref 38–126)
Anion gap: 7 (ref 5–15)
BUN: 7 mg/dL (ref 6–20)
CO2: 24 mmol/L (ref 22–32)
Calcium: 8.2 mg/dL — ABNORMAL LOW (ref 8.9–10.3)
Chloride: 106 mmol/L (ref 98–111)
Creatinine, Ser: 0.77 mg/dL (ref 0.44–1.00)
GFR, Estimated: >60 mL/min (ref >60)
Glucose, Bld: 124 mg/dL — ABNORMAL HIGH (ref 70–99)
Potassium: 4.4 mmol/L (ref 3.5–5.1)
Sodium: 137 mmol/L (ref 135–145)
Total Bilirubin: 1.4 mg/dL — ABNORMAL HIGH (ref 0.3–1.2)
Total Protein: 6.4 g/dL — ABNORMAL LOW (ref 6.5–8.1)

## 2021-09-15 LAB — LIPASE, BLOOD: Lipase: 34 U/L (ref 11–51)

## 2021-09-15 MED ORDER — HYDROMORPHONE HCL 1 MG/ML IJ SOLN: 0.5000 mg | INTRAMUSCULAR | Status: DC | PRN

## 2021-09-15 MED ORDER — POLYETHYLENE GLYCOL 3350 17 G PO PACK
17.0000 g | PACK | Freq: Every day | ORAL | Status: DC
  Administered 2021-09-15 – 2021-09-22 (×6): 17 g via ORAL
  Filled 2021-09-15 (×8): qty 1

## 2021-09-15 MED ORDER — OXYCODONE HCL 5 MG PO TABS
2.5000 mg | ORAL_TABLET | Freq: Once | ORAL | Status: AC
  Administered 2021-09-15: 2.5 mg via ORAL
  Filled 2021-09-15: qty 1

## 2021-09-15 MED ORDER — OXYCODONE HCL 5 MG PO TABS: 7.5000 mg | ORAL_TABLET | ORAL | Status: DC | PRN

## 2021-09-15 MED ORDER — NAPROXEN 250 MG PO TABS
250.0000 mg | ORAL_TABLET | Freq: Two times a day (BID) | ORAL | Status: DC | PRN
  Administered 2021-09-15 – 2021-09-21 (×3): 250 mg via ORAL
  Filled 2021-09-15 (×5): qty 1

## 2021-09-15 MED ORDER — OXYCODONE HCL 5 MG PO TABS
10.0000 mg | ORAL_TABLET | ORAL | Status: DC | PRN
  Administered 2021-09-15 – 2021-09-16 (×3): 10 mg via ORAL
  Filled 2021-09-15 (×3): qty 2

## 2021-09-15 MED ORDER — LACTATED RINGERS IV SOLN
INTRAVENOUS | Status: AC
Start: 2021-09-15 — End: 2021-09-16

## 2021-09-15 MED ORDER — PREGABALIN 50 MG PO CAPS
100.0000 mg | ORAL_CAPSULE | Freq: Three times a day (TID) | ORAL | Status: DC
  Administered 2021-09-15 – 2021-09-20 (×15): 100 mg via ORAL
  Filled 2021-09-15 (×12): qty 2
  Filled 2021-09-15: qty 1
  Filled 2021-09-15: qty 2
  Filled 2021-09-15: qty 1

## 2021-09-15 MED ORDER — OXYCODONE HCL 5 MG PO TABS
5.0000 mg | ORAL_TABLET | ORAL | Status: DC | PRN
  Administered 2021-09-15: 5 mg via ORAL
  Filled 2021-09-15: qty 1

## 2021-09-15 NOTE — Discharge Instructions (Signed)
In addition to included general post-operative instructions,  Diet: Resume home diet. Recommend avoiding or limiting fatty/greasy foods over the next few days/week. If you do eat these, you may (or may not) notice diarrhea. This is expected while your body adjusts to not having a gallbladder, and it typically resolves with time.   Activity: No heavy lifting >20 pounds (children, pets, laundry, garbage) for 4 weeks, but light activity and walking are encouraged. Do not drive or drink alcohol if taking narcotic pain medications or having pain that might distract from driving.  Wound care: 2 days after surgery (04/07), you may shower/get incision wet with soapy water and pat dry (do not rub incisions), but no baths or submerging incision underwater until follow-up.   Medications: Resume all home medications. For mild to moderate pain: acetaminophen (Tylenol) or ibuprofen/naproxen (if no kidney disease). Combining Tylenol with alcohol can substantially increase your risk of causing liver disease. Narcotic pain medications, if prescribed, can be used for severe pain, though may cause nausea, constipation, and drowsiness. Do not combine Tylenol and Percocet (or similar) within a 6 hour period as Percocet (and similar) contain(s) Tylenol. If you do not need the narcotic pain medication, you do not need to fill the prescription.  Call office ([phone] / [phone]) at any time if any questions, worsening pain, fevers/chills, bleeding, drainage from incision site, or other concerns.  

## 2021-09-15 NOTE — Anesthesia Postprocedure Evaluation (Signed)
Anesthesia Post Note ? ?Patient: Kim Lynn ? ?Procedure(s) Performed: XI ROBOTIC ASSISTED LAPAROSCOPIC CHOLECYSTECTOMY ?INDOCYANINE GREEN FLUORESCENCE IMAGING (ICG) ? ?Patient location during evaluation: PACU ?Anesthesia Type: General ?Level of consciousness: awake and alert ?Pain management: pain level controlled ?Vital Signs Assessment: post-procedure vital signs reviewed and stable ?Respiratory status: spontaneous breathing, nonlabored ventilation, respiratory function stable and patient connected to nasal cannula oxygen ?Cardiovascular status: blood pressure returned to baseline and stable ?Postop Assessment: no apparent nausea or vomiting ?Anesthetic complications: no ? ? ?No notable events documented. ? ? ?Last Vitals:  ?Vitals:  ? 09/14/21 2127 09/14/21 2231  ?BP: 109/73 108/72  ?Pulse: 72 65  ?Resp: 18 16  ?Temp: 37.1 ?C 37 ?C  ?SpO2: 97% 96%  ?  ?Last Pain:  ?Vitals:  ? 09/15/21 0037  ?TempSrc:   ?PainSc: 8   ? ? ?  ?  ?  ?  ?  ?  ? ?Lenard Simmer ? ? ? ? ?

## 2021-09-15 NOTE — Progress Notes (Signed)
?  Progress Note ? ? ?Patient: Kim Lynn:[id number] DOB: 10-Feb-1996 DOA: 09/13/2021     1 ?DOS: the patient was seen and examined on 09/15/2021 ?  ?Brief hospital course: ?Mrs. Rought is a 26 y.o. F with asthma, recent pregnancy who presented with 1 day RUQ pain and nausea. ? ?In the ER, LFTs elevated, imaging with CT and US showed dilated bile ducts.  MRCP confirmed small stone.   ? ? ?4/4: Admitted and taken for ERCP by Dr. Servando Snare for sphincterotomy, sludge in duct ?4/5: Underwent cholecystectomy ?4/6: Severe pain, unable to tolerate PO ? ? ? ? ? ?Assessment and Plan: ?* Choledocholithiasis ?Admitted and underwent ERCP that showed sludge, had sphincterotomy.   ? ?Last night underwent successful lap chole.   ? ?Today,lipase normal, LFTs improved slighyly, however unable to tolerate full liquids, vomited.  Severe pain.   ? ?At present still do not suspect post-ercp pancreatitis. ?- ADAT ?- Analgesia and antiemetics as needed ?- IV fluids  ?- Consult general surgery, discharge when able to tolerate PO ? ?Normocytic anemia ?Likely expected after delivery  ?- Follow-up with PCP ? ?Asthma, chronic ?No evidence of flare ?-Continue ICS/LAMA ? ?Depression ?- Continue sertraline ? ? ? ? ?  ? ?Subjective: Still having severe pain, mostly in the right upper quadrant, near her incisions.  Had vomiting after full liquids with lunch.  Feels overall very tired.  No confusion, fever ? ?Physical Exam: ?Vitals:  ? 09/14/21 2127 09/14/21 2231 09/15/21 0400 09/15/21 0802  ?BP: 109/73 108/72 106/67 126/80  ?Pulse: 72 65 68 (!) 58  ?Resp: 18 16 18 18   ?Temp: 98.7 ?F (37.1 ?C) 98.6 ?F (37 ?C) 98 ?F (36.7 ?C) 98 ?F (36.7 ?C)  ?TempSrc: Oral   Oral  ?SpO2: 97% 96% 97% 100%  ?Weight:      ?Height:      ? ?Adult female, lying in bed, appears pale and uncomfortable.  RRR, no murmurs, no peripheral edema ?Lungs clear without rales or wheezes ?Abdomen with tenderness in the right upper quadrant mostly, but relatively diffuse, no voluntary  guarding, no rigidity or guarding, no ascites ?Attention normal, affect blunted, judgment insight appear normal, moves all extremities with generalized weakness but symmetric strength, face symmetric, speech fluent. ? ? ? ? ? ? ? ?Data Reviewed: ?Discussed with general surgery team, nursing notes reviewed, op notes reviewed, vital signs reviewed. ?Lipase remains normal ?LFTs are slightly improved ?Total bilirubin is slightly improved. ? ?Family Communication: Husband at the bedside ? ?Disposition: ?Status is: Inpatient ?The patient was admitted with choledocholithiasis.  She underwent ERCP with sphincterotomy, then cholecystectomy. ? ?Postcholecystectomy she is having a lot of pain and is not able to tolerate a diet. ? ?We will continue IV fluids, advance diet as tolerated, hopefully she will be able to advance tomorrow and we will discharge the ? ? ? ? ? ? Planned Discharge Destination: Home ? ? ? ?Time spent:   minutes ? ?Author: ? , MD ?09/15/2021 3:44 PM ? ?For on call review [email or web address].  ?

## 2021-09-15 NOTE — TOC Initial Note (Signed)
Transition of Care (TOC) - Initial/Assessment Note  ? ? ?Patient Details  ?Name: Kim Lynn ?MRN: [id number] ?Date of Birth: 05/08/1996 ? ?Transition of Care (TOC) CM/SW Contact:    ?Chapman Fitch, RN ?Phone Number: ?09/15/2021, 3:26 PM ? ?Clinical Narrative:                 ? ?Transition of Care (TOC) Screening Note ? ? ?Patient Details  ?Name: Kim Lynn ?Date of Birth: 1996/05/12 ? ? ?Transition of Care (TOC) CM/SW Contact:    ?Chapman Fitch, RN ?Phone Number: ?09/15/2021, 3:27 PM ? ? ? ?Transition of Care Department [facility]) has reviewed patient and no TOC needs have been identified at this time. We will continue to monitor patient advancement through interdisciplinary progression rounds. If new patient transition needs arise, please place a TOC consult. ? ? ? ?  ?  ? ? ?Patient Goals and CMS Choice ?  ?  ?  ? ?Expected Discharge Plan and Services ?  ?  ?  ?  ?  ?                ?  ?  ?  ?  ?  ?  ?  ?  ?  ?  ? ?Prior Living Arrangements/Services ?  ?  ?  ?       ?  ?  ?  ?  ? ?Activities of Daily Living ?Home Assistive Devices/Equipment: None ?ADL Screening (condition at time of admission) ?Patient's cognitive ability adequate to safely complete daily activities?: Yes ?Is the patient deaf or have difficulty hearing?: No ?Does the patient have difficulty seeing, even when wearing glasses/contacts?: No ?Does the patient have difficulty concentrating, remembering, or making decisions?: No ?Patient able to express need for assistance with ADLs?: Yes ?Does the patient have difficulty dressing or bathing?: No ?Independently performs ADLs?: Yes (appropriate for developmental age) ?Does the patient have difficulty walking or climbing stairs?: No ?Weakness of Legs: None ?Weakness of Arms/Hands: None ? ?Permission Sought/Granted ?  ?  ?   ?   ?   ?   ? ?Emotional Assessment ?  ?  ?  ?  ?  ?  ? ?Admission diagnosis:  Choledocholithiasis [K80.50] ?Right upper quadrant abdominal pain [R10.11] ?Patient Active Problem  List  ? Diagnosis Date Noted  ? Asthma, chronic 09/14/2021  ? Normocytic anemia 09/14/2021  ? Choledocholithiasis 09/13/2021  ? Depression 09/13/2021  ? Encounter for planned induction of labor 08/16/2021  ? ?PCP:  Pcp, No ?Pharmacy:   ?[id number] - [place], [place] - [address] AT [facility] OF [address] ?[address] ?[place] [address] ?Phone: [phone] Fax: [phone] ? ?[id number] [place], [place] - [address] ?[address] ?[place] [address] ?Phone: [phone] Fax: [phone] ? ? ? ? ?Social Determinants of Health (SDOH) Interventions ?  ? ?Readmission Risk Interventions ?   ? View : No data to display.  ?  ?  ?  ? ? ? ?

## 2021-09-15 NOTE — Progress Notes (Signed)
[facility] ?SURGICAL PROGRESS NOTE ? ?Hospital Day(s): 1.  ? ?Post op day(s): 1 Day Post-Op.  ? ?Interval History:  ?Patient seen and examined ?No acute events or new complaints overnight.  ?Patient reports she is hurting this morning; incisional soreness, some shoulder and chest discomfort ?No feer, chills, nausea, emesis  ?Labs are reassuring; lipase has normalized; hyperbilirubinemia improving ?She is on CLD this morning; not eating much  ? ?Vital signs in last 24 hours: [min-max] current  ?Temp:  [97.5 ?F (36.4 ?C)-98.7 ?F (37.1 ?C)] 98 ?F (36.7 ?C) (04/06 0400) ?Pulse Rate:  [63-97] 68 (04/06 0400) ?Resp:  [12-24] 18 (04/06 0400) ?BP: (98-117)/(56-82) 106/67 (04/06 0400) ?SpO2:  [94 %-100 %] 97 % (04/06 0400) ?Weight:  [74.8 kg] 74.8 kg (04/05 1657)     Height: 5\' 7"  (170.2 cm) Weight: 74.8 kg BMI (Calculated): 25.82  ? ?Intake/Output last 2 shifts:  ?04/05 0701 - 04/06 0700 ?In: 3991.4 [P.O.:125; I.V.:3766.4; IV Piggyback:100] ?Out: 900 [Urine:900]  ? ?Physical Exam:  ?Constitutional: alert, cooperative and no distress  ?Respiratory: breathing non-labored at rest  ?Cardiovascular: regular rate and sinus rhythm  ?Gastrointestinal: Soft, incisional soreness, non-distended, no rebound/guarding ?Integumentary: Laparoscopic incisions are CDI with dermabond; no erythema or drainage  ? ?Labs:  ? ?  Latest Ref Rng & Units 09/14/2021  ?  5:40 AM 09/13/2021  ?  4:10 AM 09/12/2021  ?  5:23 PM  ?CBC  ?WBC 4.0 - 10.5 K/uL 6.3   9.9   10.5    ?Hemoglobin 12.0 - 15.0 g/dL 9.9   11.3   11.5    ?Hematocrit 36.0 - 46.0 % 30.9   36.2   36.3    ?Platelets 150 - 400 K/uL 276   360   373    ? ? ?  Latest Ref Rng & Units 09/15/2021  ?  5:13 AM 09/14/2021  ?  5:40 AM 09/13/2021  ?  4:10 AM  ?CMP  ?Glucose 70 - 99 mg/dL 124   100   128    ?BUN 6 - 20 mg/dL 7   6   8     ?Creatinine 0.44 - 1.00 mg/dL 0.77   0.72   0.97    ?Sodium 135 - 145 mmol/L 137   135   142    ?Potassium 3.5 - 5.1 mmol/L 4.4   3.4   3.6    ?Chloride 98 - 111  mmol/L 106   107   106    ?CO2 22 - 32 mmol/L 24   23   27     ?Calcium 8.9 - 10.3 mg/dL 8.2   7.6   8.6    ?Total Protein 6.5 - 8.1 g/dL 6.4   5.5   7.4    ?Total Bilirubin 0.3 - 1.2 mg/dL 1.4   1.9   2.7    ?Alkaline Phos 38 - 126 U/L 88   92   110    ?AST 15 - 41 U/L 31   55   133    ?ALT 0 - 44 U/L 54   72   81    ? ? ?Imaging studies: No new pertinent imaging studies ? ? ?Assessment/Plan ?26 y.o. female 1 Day Post-Op s/p robotic assisted laparoscopic cholecystectomy for choledocholithiasis and cholecystitis ? ? - Continue CLD for now; wrote orders to advance diet as tolerated when ready ?- Wean/discontinue IVF as diet advances ?- Monitor abdominal examination; on-going bowel function ?- Pain control prn; antiemetics prn  ? - Mobilization  as tolerated  ? - Further management per primary service ?  ? - Discharge Planning: Pending improvement in pain control and toleration of diet advancement. Hopefully she can still go home this afternoon.  ? ?All of the above findings and recommendations were discussed with the patient, patient's family, and the medical team, and all of their questions were answered to their expressed satisfaction. ? ?-- ?Edison Simon, PA-C ?[facility] ?09/15/2021, 7:51 AM ?[phone] ?M-F: 7am - 4pm ? ?

## 2021-09-16 DIAGNOSIS — E876 Hypokalemia: Secondary | ICD-10-CM

## 2021-09-16 DIAGNOSIS — K8050 Calculus of bile duct without cholangitis or cholecystitis without obstruction: Secondary | ICD-10-CM | POA: Diagnosis not present

## 2021-09-16 LAB — COMPREHENSIVE METABOLIC PANEL
ALT: 34 U/L (ref 0–44)
AST: 19 U/L (ref 15–41)
Albumin: 2.8 g/dL — ABNORMAL LOW (ref 3.5–5.0)
Alkaline Phosphatase: 65 U/L (ref 38–126)
Anion gap: 8 (ref 5–15)
BUN: 8 mg/dL (ref 6–20)
CO2: 25 mmol/L (ref 22–32)
Calcium: 7.9 mg/dL — ABNORMAL LOW (ref 8.9–10.3)
Chloride: 105 mmol/L (ref 98–111)
Creatinine, Ser: 0.91 mg/dL (ref 0.44–1.00)
GFR, Estimated: >60 mL/min (ref >60)
Glucose, Bld: 97 mg/dL (ref 70–99)
Potassium: 3.3 mmol/L — ABNORMAL LOW (ref 3.5–5.1)
Sodium: 138 mmol/L (ref 135–145)
Total Bilirubin: 1.2 mg/dL (ref 0.3–1.2)
Total Protein: 5.5 g/dL — ABNORMAL LOW (ref 6.5–8.1)

## 2021-09-16 LAB — CBC
HCT: 29.2 % — ABNORMAL LOW (ref 36.0–46.0)
Hemoglobin: 9.1 g/dL — ABNORMAL LOW (ref 12.0–15.0)
MCH: 27.3 pg (ref 26.0–34.0)
MCHC: 31.2 g/dL (ref 30.0–36.0)
MCV: 87.7 fL (ref 80.0–100.0)
Platelets: 297 10*3/uL (ref 150–400)
RBC: 3.33 MIL/uL — ABNORMAL LOW (ref 3.87–5.11)
RDW: 14.4 % (ref 11.5–15.5)
WBC: 8 10*3/uL (ref 4.0–10.5)
nRBC: 0 % (ref 0.0–0.2)

## 2021-09-16 LAB — LIPASE, BLOOD: Lipase: 33 U/L (ref 11–51)

## 2021-09-16 LAB — SURGICAL PATHOLOGY

## 2021-09-16 MED ORDER — HYDROMORPHONE HCL 2 MG PO TABS
2.0000 mg | ORAL_TABLET | ORAL | Status: DC | PRN
  Administered 2021-09-16 – 2021-09-20 (×18): 2 mg via ORAL
  Filled 2021-09-16 (×19): qty 1

## 2021-09-16 MED ORDER — LACTATED RINGERS IV SOLN: INTRAVENOUS | Status: DC

## 2021-09-16 MED ORDER — ALUM & MAG HYDROXIDE-SIMETH 200-200-20 MG/5ML PO SUSP
30.0000 mL | Freq: Four times a day (QID) | ORAL | Status: DC | PRN
  Administered 2021-09-17 – 2021-09-19 (×2): 30 mL via ORAL
  Filled 2021-09-16 (×2): qty 30

## 2021-09-16 MED ORDER — KCL IN DEXTROSE-NACL 20-5-0.9 MEQ/L-%-% IV SOLN
INTRAVENOUS | Status: DC
Start: 2021-09-16 — End: 2021-09-22
  Filled 2021-09-16 (×20): qty 1000

## 2021-09-16 NOTE — Progress Notes (Signed)
Mobility Specialist - Progress Note ? ? ? 09/16/21 1600  ?Mobility  ?Activity Ambulated independently in hallway;Stood at bedside;Dangled on edge of bed  ?Level of Assistance Independent  ?Assistive Device Other (Comment) ?(IV pole)  ?Distance Ambulated (ft) 400 ft  ?Activity Response Tolerated well  ?$Mobility charge 1 Mobility  ? ? ?Pt ambulated 452ft in the hallway indep. voicing no complaints. Second ambulation this date. ? ?Clarisa Schools ?Mobility Specialist ?09/16/21, 4:29 PM ? ? ? ? ?

## 2021-09-16 NOTE — Progress Notes (Signed)
[facility] ?SURGICAL PROGRESS NOTE ? ?Hospital Day(s): 2.  ? ?Post op day(s): 2 Days Post-Op.  ? ?Interval History:  ?Patient seen and examined ?Overnight, continued to have issues with nausea/emesis ?This morning, she reports she continues to have nausea, no more emesis ?Still with abdominal soreness but pain medications helping ?She remains without leukocytosis; 8.0K ?Renal function remains normal; sCr - 0.91; UO - output ?Mild hypokalemia to 3.3 ?Bilirubin normalized; 1.2 ?She is NPO this morning  ? ?Vital signs in last 24 hours: [min-max] current  ?Temp:  [97.8 ?F (36.6 ?C)-98.5 ?F (36.9 ?C)] 97.8 ?F (36.6 ?C) (04/07 2585) ?Pulse Rate:  [58-70] 68 (04/07 0233) ?Resp:  [18-20] 20 (04/07 0233) ?BP: (98-126)/(55-80) 118/75 (04/07 0233) ?SpO2:  [98 %-100 %] 98 % (04/07 0233)     Height: 5\' 7"  (170.2 cm) Weight: 74.8 kg BMI (Calculated): 25.82  ? ?Intake/Output last 2 shifts:  ?04/06 0701 - 04/07 0700 ?In: -  ?Out: 320 [Emesis/NG output:320]  ? ?Physical Exam:  ?Constitutional: alert, cooperative and no distress  ?Respiratory: breathing non-labored at rest  ?Cardiovascular: regular rate and sinus rhythm  ?Gastrointestinal: Soft, incisional soreness, non-distended, no rebound/guarding ?Integumentary: Laparoscopic incisions are CDI with dermabond; no erythema or drainage  ? ?Labs:  ? ?  Latest Ref Rng & Units 09/16/2021  ?  6:02 AM 09/14/2021  ?  5:40 AM 09/13/2021  ?  4:10 AM  ?CBC  ?WBC 4.0 - 10.5 K/uL 8.0   6.3   9.9    ?Hemoglobin 12.0 - 15.0 g/dL 9.1   9.9   11/13/2021    ?Hematocrit 36.0 - 46.0 % 29.2   30.9   36.2    ?Platelets 150 - 400 K/uL 297   276   360    ? ? ?  Latest Ref Rng & Units 09/16/2021  ?  6:02 AM 09/15/2021  ?  5:13 AM 09/14/2021  ?  5:40 AM  ?CMP  ?Glucose 70 - 99 mg/dL 97   11/14/2021   824    ?BUN 6 - 20 mg/dL 8   7   6     ?Creatinine 0.44 - 1.00 mg/dL 235     3.61    ?Sodium 135 - 145 mmol/L 138   137   135    ?Potassium 3.5 - 5.1 mmol/L 3.3   4.4   3.4    ?Chloride 98 - 111 mmol/L 105   106    107    ?CO2 22 - 32 mmol/L 25   24   23     ?Calcium 8.9 - 10.3 mg/dL 7.9   8.2   7.6    ?Total Protein 6.5 - 8.1 g/dL 5.5   6.4   5.5    ?Total Bilirubin 0.3 - 1.2 mg/dL 1.2   1.4   1.9    ?Alkaline Phos 38 - 126 U/L 65   88   92    ?AST 15 - 41 U/L 19   31   55    ?ALT 0 - 44 U/L 34   54   72    ? ? ?Imaging studies: No new pertinent imaging studies ? ? ?Assessment/Plan ?26 y.o. female persistent nausea/emesis 2 Days Post-Op s/p robotic assisted laparoscopic cholecystectomy for choledocholithiasis and cholecystitis ? ? - I would leave her NPO for now given persistent nausea; can consider CLD later today if clinically doing better ?- I do not think she warrants any further investigation at the moment as she is  without leukocytosis and her bilirubin level is normal.   ?- Wean/discontinue IVF as diet advances ?- Monitor abdominal examination; on-going bowel function ?- Pain control prn; antiemetics prn  ? - Mobilization as tolerated  ? - Further management per primary service ?  ? - Discharge Planning: Pending improvement in pain control and toleration of diet advancement. ? ?All of the above findings and recommendations were discussed with the patient, patient's family, and the medical team, and all of their questions were answered to their expressed satisfaction. ? ?-- ?Lynden Oxford, PA-C ?[facility] ?09/16/2021, 7:39 AM ?[phone] ?M-F: 7am - 4pm ? ?

## 2021-09-16 NOTE — Progress Notes (Signed)
Patient is unable to keep clear liquids with the exception of water down.  Dr. Lysle Pearl notified.  Instructed to keep patient NPO and notify day shift surgeon in the morning of the changes in status.  Will continue to monitor.   ?Phoebe Sharps N ? ?

## 2021-09-16 NOTE — Progress Notes (Signed)
Mobility Specialist - Progress Note ? ? 09/16/21 1500  ?Mobility  ?Activity Ambulated independently in hallway;Stood at bedside;Dangled on edge of bed  ?Level of Assistance Independent  ?Assistive Device Other (Comment) ?(IV Pole)  ?Distance Ambulated (ft) 600 ft  ?Activity Response Tolerated well  ?$Mobility charge 1 Mobility  ? ? ?Pt supine upon arrival using RA with family at bedside. Completes bed mobility and STS indep --- one LOB noted while donning shoes but self corrected. Pt ambulates 658ft with 2 pt initiated breaks d/t "deep pain" under breast area. Pt returns to room with needs in reach and family at bedside. ? ?Clarisa Schools ?Mobility Specialist ?09/16/21, 3:32 PM ? ? ? ? ?

## 2021-09-16 NOTE — Progress Notes (Signed)
?  Progress Note ? ? ?Patient: Kim Lynn [id number] DOB: 04/17/1996 DOA: 09/13/2021     2 ?DOS: the patient was seen and examined on 09/16/2021 ?  ?Brief hospital course: ?Mrs. Housey is a 26 y.o. F with asthma, recent pregnancy who presented with 1 day RUQ pain and nausea. ? ?In the ER, LFTs elevated, imaging with CT and US showed dilated bile ducts.  MRCP confirmed small stone.   ? ? ?4/4: Admitted and taken for ERCP by Dr. Allen Norris for sphincterotomy, sludge in duct ?4/5: Underwent cholecystectomy ?4/6: Severe pain, unable to tolerate PO ? ? ? ? ? ?Assessment and Plan: ?* Choledocholithiasis ?See summary from yesterday. ?Still do not suspect post ERCP pancreatitis, or infection/complication from surgery given her normal CBC, lipase, vital signs, and clinical exam, however she is unable to tolerate anything by mouth, including clear liquids last night, so we will continue IV fluids, analgesics, and antiemetics and reevaluate again for the next 24 hours ? ? ? ? ? ?  ? ?Subjective: Still with severe pain, vomiting overnight.  No jaundice, no fever, no confusion ? ? ?Physical Exam: ?Vitals:  ? 09/15/21 1558 09/15/21 1941 09/16/21 0233 09/16/21 0850  ?BP: (!) 98/55 100/71 118/75 110/79  ?Pulse: 70 70 68 60  ?Resp: 18 20 20 18   ?Temp: 98.5 ?F (36.9 ?C) 98.1 ?F (36.7 ?C) 97.8 ?F (36.6 ?C) 97.8 ?F (36.6 ?C)  ?TempSrc: Oral Oral Oral   ?SpO2: 99% 98% 98% 98%  ?Weight:      ?Height:      ? ?Adult female, lying in bed, appears weak, tired, RRR, no murmurs, no peripheral edema, lungs clear without rales or wheezes, abdomen with exquisite tenderness to light palpation of the skin of the right upper quadrant, no voluntary guarding, no rebound, no rigidity, no distention, affect blunted, judgment insight appear normal, moves all extremities with generalized weakness but symmetric strength, face symmetric, speech fluent.    ? ? ? ? ? ? ? ?Data Reviewed: ?Discussed with general surgery team, nursing notes reviewed, op notes  reviewed, vital signs reviewed. ?Lipase normal, LFTs resolved, total bili department resolved ?Family Communication: Husband at the bedside ? ?Disposition: ?Status is: Inpatient ?The patient was admitted with choledocholithiasis.  She underwent ERCP with sphincterotomy, then cholecystectomy. ? ?Postcholecystectomy she is having a lot of pain and is not able to tolerate a diet. ? ?We will continue IV fluids, advance diet as tolerated, hopefully she will be able to advance tomorrow and we will discharge the ? ? ? ? ? ? Planned Discharge Destination: Home ? ? ? ?Time spent:   minutes ? ?Author: ?Edwin Dada, MD ?09/16/2021 3:25 PM ? ?For on call review [email or web address].  ?

## 2021-09-17 ENCOUNTER — Inpatient Hospital Stay: Payer: 59

## 2021-09-17 DIAGNOSIS — R112 Nausea with vomiting, unspecified: Secondary | ICD-10-CM

## 2021-09-17 DIAGNOSIS — D649 Anemia, unspecified: Secondary | ICD-10-CM | POA: Diagnosis not present

## 2021-09-17 DIAGNOSIS — K8050 Calculus of bile duct without cholangitis or cholecystitis without obstruction: Secondary | ICD-10-CM | POA: Diagnosis not present

## 2021-09-17 DIAGNOSIS — F32A Depression, unspecified: Secondary | ICD-10-CM | POA: Diagnosis not present

## 2021-09-17 DIAGNOSIS — J45909 Unspecified asthma, uncomplicated: Secondary | ICD-10-CM | POA: Diagnosis not present

## 2021-09-17 LAB — CBC
HCT: 34.1 % — ABNORMAL LOW (ref 36.0–46.0)
Hemoglobin: 10.6 g/dL — ABNORMAL LOW (ref 12.0–15.0)
MCH: 27.5 pg (ref 26.0–34.0)
MCHC: 31.1 g/dL (ref 30.0–36.0)
MCV: 88.3 fL (ref 80.0–100.0)
Platelets: 348 10*3/uL (ref 150–400)
RBC: 3.86 MIL/uL — ABNORMAL LOW (ref 3.87–5.11)
RDW: 14.6 % (ref 11.5–15.5)
WBC: 6.6 10*3/uL (ref 4.0–10.5)
nRBC: 0 % (ref 0.0–0.2)

## 2021-09-17 LAB — COMPREHENSIVE METABOLIC PANEL
ALT: 33 U/L (ref 0–44)
AST: 23 U/L (ref 15–41)
Albumin: 3.2 g/dL — ABNORMAL LOW (ref 3.5–5.0)
Alkaline Phosphatase: 67 U/L (ref 38–126)
Anion gap: 6 (ref 5–15)
BUN: 5 mg/dL — ABNORMAL LOW (ref 6–20)
CO2: 26 mmol/L (ref 22–32)
Calcium: 8.3 mg/dL — ABNORMAL LOW (ref 8.9–10.3)
Chloride: 109 mmol/L (ref 98–111)
Creatinine, Ser: 0.85 mg/dL (ref 0.44–1.00)
GFR, Estimated: >60 mL/min (ref >60)
Glucose, Bld: 102 mg/dL — ABNORMAL HIGH (ref 70–99)
Potassium: 3.6 mmol/L (ref 3.5–5.1)
Sodium: 141 mmol/L (ref 135–145)
Total Bilirubin: 1.3 mg/dL — ABNORMAL HIGH (ref 0.3–1.2)
Total Protein: 6.5 g/dL (ref 6.5–8.1)

## 2021-09-17 LAB — URINALYSIS, COMPLETE (UACMP) WITH MICROSCOPIC
Bacteria, UA: NONE SEEN
Bilirubin Urine: NEGATIVE
Glucose, UA: NEGATIVE mg/dL
Hgb urine dipstick: NEGATIVE
Ketones, ur: NEGATIVE mg/dL
Leukocytes,Ua: NEGATIVE
Nitrite: NEGATIVE
Protein, ur: NEGATIVE mg/dL
Specific Gravity, Urine: 1.013 (ref 1.005–1.030)
Squamous Epithelial / HPF: NONE SEEN (ref 0–5)
WBC, UA: NONE SEEN WBC/hpf (ref 0–5)
pH: 6 (ref 5.0–8.0)

## 2021-09-17 LAB — LIPASE, BLOOD: Lipase: 41 U/L (ref 11–51)

## 2021-09-17 LAB — APTT: aPTT: 30 seconds (ref 24–36)

## 2021-09-17 LAB — D-DIMER, QUANTITATIVE: D-Dimer, Quant: 2.06 ug/mL-FEU — ABNORMAL HIGH (ref 0.00–0.50)

## 2021-09-17 MED ORDER — HEPARIN BOLUS VIA INFUSION
5300.0000 [IU] | Freq: Once | INTRAVENOUS | Status: AC
  Administered 2021-09-17: 5300 [IU] via INTRAVENOUS
  Filled 2021-09-17: qty 5300

## 2021-09-17 MED ORDER — ALPRAZOLAM 0.25 MG PO TABS
0.2500 mg | ORAL_TABLET | Freq: Once | ORAL | Status: AC
  Administered 2021-09-17: 0.25 mg via ORAL
  Filled 2021-09-17: qty 1

## 2021-09-17 MED ORDER — IOHEXOL 9 MG/ML PO SOLN
500.0000 mL | ORAL | Status: AC
  Administered 2021-09-17 (×2): 500 mL via ORAL

## 2021-09-17 MED ORDER — IOHEXOL 300 MG/ML  SOLN
100.0000 mL | Freq: Once | INTRAMUSCULAR | Status: AC | PRN
  Administered 2021-09-17: 100 mL via INTRAVENOUS

## 2021-09-17 MED ORDER — IOHEXOL 350 MG/ML SOLN
75.0000 mL | Freq: Once | INTRAVENOUS | Status: AC | PRN
  Administered 2021-09-17: 75 mL via INTRAVENOUS

## 2021-09-17 MED ORDER — HEPARIN (PORCINE) 25000 UT/250ML-% IV SOLN
1250.0000 [IU]/h | INTRAVENOUS | Status: DC
  Administered 2021-09-17: 1250 [IU]/h via INTRAVENOUS
  Filled 2021-09-17: qty 250

## 2021-09-17 NOTE — Progress Notes (Signed)
Patient ambulating in hallways, still continues with RUQ pain,medicated with po dilaudid times 2.  Emesis times 3 thus far in the shift for a total of 175 ml. Margaret Pyle M ? ?

## 2021-09-17 NOTE — Progress Notes (Signed)
Cross Cover ? ?Result of CTA chest with small RLL posterior pulmonary embolism reported. Pharmacy consult for heparin therapy placed after checking with with general surgeon Dr Hazle Quant Discussed findings with patient and spouse..  Explained hypercoagulable state in post pardum phase.  ?Patient also discussed with me her anxiety and depression symptoms.  Was started on zoloft by her OB 4 weeks ago, but patient states she still feels highly anxious and depressed. Explained to patient it does take time for meds to work and doses are typically adjusted every 6 weeks ?Dose of xanax ordered so patient could get good night sleep. Currently she is dumping her breast milk due to multiple meds and contrast dye.  ?Discussed mother baby boarding while patient in hospital with [facility] and perhaps have her moved to mother/baby unit ? ?

## 2021-09-17 NOTE — Progress Notes (Signed)
?  Progress Note ? ? ?Patient: Kim Lynn [id number] DOB: 1995-12-23 DOA: 09/13/2021     3 ?DOS: the patient was seen and examined on 09/17/2021 ?  ?Brief hospital course: ?Mrs. Haydel is a 26 y.o. F with asthma, recent pregnancy who presented with 1 day RUQ pain and nausea. ? ?In the ER, LFTs elevated, imaging with CT and US showed dilated bile ducts.  MRCP confirmed small stone.   ? ? ?4/4: Admitted and taken for ERCP by Dr. Allen Norris for sphincterotomy, sludge in duct ?4/5: Cholecystectomy ?4/6-4/8: Persistent vomiting, inability to eat ? ?Assessment and Plan: ?Intractable nausea and vomiting ?Since cholecystectomy, patient has had 48 hours vomiting all solid or liquid intake.   ? ?She has had no fever, leukocytosis.  She has had no elevated lipase.  Today, CT abdomen and pelvis with oral and IV contrast was obtained, that shows no pancreatitis, abdominal fluid collection, intra-abdominal free air.  There is been a decrease in size of the common bile duct and all findings on CT were expected postsurgical changes postcholecystectomy. ? ?- Advance diet as tolerated ?- Until able to tolerate fluids or solids, continue IV fluids ?- Continue analgesics and antiemetics ? ?Choledocholithiasis ?Admitted and underwent ERCP and then cholecystectomy.  Resolved ? ?Hypokalemia ?Supplemented ? ?Normocytic anemia ?Likely expected after delivery  ?- Follow-up with PCP ? ?Asthma, chronic ?No evidence of flare ?-Continue ICS/LAMA ? ? ?Depression ?- Continue sertraline ? ? ? ? ?  ? ?Subjective: Patient's vomited twice overnight, and twice today.  She still has pain.  She had some transient redness over her right abdomen, which resolved spontaneously, and had no raised or papular component.  She has had no fever, no confusion, no chest pain or dyspnea. ? ?Physical Exam: ?Vitals:  ? 09/16/21 1958 09/17/21 0248 09/17/21 0809 09/17/21 1527  ?BP: 118/86 108/81 112/81 117/74  ?Pulse: 64 64 70 81  ?Resp: 18 18 20 20   ?Temp: 97.9 ?F (36.6 ?C)  97.9 ?F (36.6 ?C) 98.1 ?F (36.7 ?C) 98.8 ?F (37.1 ?C)  ?TempSrc: Oral Oral Oral Oral  ?SpO2: 98% 95% 100% 98%  ?Weight:      ?Height:      ? ?Adult female, lying in bed, no acute distress.  RRR, no murmurs, respirations normal, attention normal, affect blunted, judgment insight appear normal, abdomen tenderness light palpation. ? ? ?Data Reviewed: ?Discussed with general surgery.  CMP and lipase normal.  CT abdomen normal. ? ?Family Communication: Husband at the bedside ? ?Disposition: ?Status is: Inpatient ?She is still unable to tolerate any oral intake and will require ongoing IV fluid ? ? ? ? Planned Discharge Destination:  ? ? ? ?Time spent:  minutes ? ?Author: ?Edwin Dada, MD ?09/17/2021 4:39 PM ? ?For on call review [email or web address]  ?

## 2021-09-17 NOTE — Assessment & Plan Note (Signed)
Supplemented 

## 2021-09-17 NOTE — Consult Note (Signed)
ANTICOAGULATION CONSULT NOTE - Initial Consult ? ?Pharmacy Consult for IV heparin drip ?Indication: pulmonary embolus ? ?Allergies  ?Allergen Reactions  ? Aspirin Other (See Comments)  ?  Liver disorder  ? Tylenol [Acetaminophen] Other (See Comments)  ?  Liver disorder  ? ? ?Patient Measurements: ?Height: 5\' 7"  (170.2 cm) ?Weight: 74.8 kg (164 lb 14.5 oz) ?IBW/kg (Calculated) : 61.6 ?Heparin Dosing Weight: 74.8kg ? ?Vital Signs: ?Temp: 98.3 ?F (36.8 ?C) (04/08 2010) ?Temp Source: Oral (04/08 1527) ?BP: 106/78 (04/08 2010) ?Pulse Rate: 70 (04/08 2010) ? ?Labs: ?Recent Labs  ?  09/15/21 ?[id number] 09/16/21 ?0602 09/17/21 ?0425  ?HGB  --  9.1* 10.6*  ?HCT  --  29.2* 34.1*  ?PLT  --  297 348  ?CREATININE 0.77 0.91 0.85  ? ? ?Estimated Creatinine Clearance: 108.7 mL/min (by C-G formula based on SCr of 0.85 mg/dL). ? ? ?Medical History: ?Past Medical History:  ?Diagnosis Date  ? Anxiety   ? Rosanna Randy syndrome   ? ? ?Medications:  ?PTA: No AC/APT prior to admission ?Inpatient: Heparin drip 4/8 >>> ?Allergies: Reported "liver disorder" with aspirin ? ? ?Assessment: ?26 y.o. F with asthma, recent pregnancy who presented with 1 day RUQ pain and nausea. CT Angio Chest done 4/8 @ 2026 reports "Minimal pulmonary emboli in the right lower lobe posteriorly." Pharmacy consulted for management of heparin drip inpatient in the setting of pulmonary embolism.  ? ?Baseline Labs: ?aPTT - ordered ?INR -  ?Hgb - 10.6 ?Plts - 348 ? ? ?Date Time aPTT/HL Rate/Comment ? ? ? ?Goal of Therapy:  ?Heparin level 0.3-0.7 units/ml ?Monitor platelets by anticoagulation protocol: Yes ?  ?Plan:  ?Give 5300 units bolus x1; then start heparin infusion at 1250 units/hr ?Check aPTT/Anti-Xa level in 6 hours and daily once consecutively therapeutic.  ?Titrate by aPTT's until lab correlation is noted, then titrate by anti-xa alone. ?Continue to monitor H&H and platelets daily while on heparin gtt. ?    ? ?Thank you for allowing pharmacy to be a part of this patient?s  care. ? ?Darrick Penna, PharmD, MS PGPM ?Clinical Pharmacist ?09/17/2021 ?9:13 PM ? ? ? ?

## 2021-09-17 NOTE — Progress Notes (Signed)
Patient ID: Kim Lynn, female   DOB: 1995/10/07, 26 y.o.   MRN: [id number] ?SURGERY FOLLOW UP NOTE ? ?Patient was reevaluated.  Patient was seen walking around the hall earlier today.  She is still with significant discomfort in the upper abdomen.  She might feel it is a gas.  She has not had any gas during the whole day.  Pain radiates to the chest on both sides. ? ?Vitals:  ? 09/17/21 0809 09/17/21 1527  ?BP: 112/81 117/74  ?Pulse: 70 81  ?Resp: 20 20  ?Temp: 98.1 ?F (36.7 ?C) 98.8 ?F (37.1 ?C)  ?SpO2: 100% 98%  ? ?Vital signs within normal limit.  There has been no fever. ?Abdomen: Soft and depressible, tender to palpation in the right upper quadrant. ? ?CT scan of the abdomen and pelvis: There is expected postoperative changes.  No fluid collection.  No free air.  I personally evaluated the images. ? ?Assessment and plan: ?Patient reevaluated oriented about the CT scan results.  There is no sign of intra-abdominal complications.  No fluid to suspect any bile leak.  No free air.  I discussed these findings with the patient.  We will continue with current pain management and nausea medications. ? ?Carolan Shiver, MD ? ?

## 2021-09-17 NOTE — Progress Notes (Signed)
Mobility Specialist - Progress Note ? ? 09/17/21 1500  ?Mobility  ?Activity Ambulated independently in hallway  ?Level of Assistance Independent  ?Distance Ambulated (ft) 800 ft  ?Activity Response Tolerated well  ?$Mobility charge 1 Mobility  ? ? ? ?Pt ambulated in hallway independently, second time this date. Grimacing from pain in chest and back but motivated to continue. ? ?Clarisa Schools ?Mobility Specialist ?09/17/21, 3:24 PM ? ? ? ? ?

## 2021-09-17 NOTE — Progress Notes (Signed)
Mobility Specialist - Progress Note ? ? ? 09/17/21 1400  ?Mobility  ?Activity Ambulated independently in hallway  ?Level of Assistance Independent  ?Assistive Device Other (Comment) ?(IV Pole)  ?Distance Ambulated (ft) 600 ft  ?Activity Response Tolerated well  ?$Mobility charge 1 Mobility  ? ? ?Pt completes ambulation in hallway indep voicing complaints of pain in chest and lower back but tolerates well. Pt requests to ambulate again after CT later this date. ? ?Clarisa Schools ?Mobility Specialist ?09/17/21, 2:37 PM ? ? ? ?

## 2021-09-17 NOTE — Assessment & Plan Note (Addendum)
Since cholecystectomy, patient has had now 5 days of all solid or liquid intake except water.   ? ?She has had no fever, leukocytosis.  She has had no elevated lipase.   CT abdomen and pelvis with oral and IV contrast was obtained, that shows no pancreatitis, abdominal fluid collection, intra-abdominal free air.  There is been a decrease in size of the common bile duct and all findings on CT were expected postsurgical changes postcholecystectomy.  HIDA normal. ? ?-Continue IV fluids ?-Consult to IR for post-pyloric Dobhoff tomorrow then start tube feeds ?-Consult GI for recommendations re: third or fourth line nausea management ? ? ?Pain is migratory.  Now leaving the window when severe pain would be expected. ?- Stop lyrica ?- Start to space out PO Dilaudid and wean ? ?  ?

## 2021-09-17 NOTE — Progress Notes (Signed)
CTA chest shows minimal right lower lobe PE.  This is consistent with her right sided pain, but could also be incidental.  Very low risk of imminent harm given this small degree of clot and no clinical signs today of leg clot. I will ultrasound the legs (which are asymptomatic and probably clot free) tomorrow and will discuss benefits and harms of anticoagulation.

## 2021-09-17 NOTE — Progress Notes (Signed)
Patient ID: Kim Lynn, female   DOB: 01-Feb-1996, 25 y.o.   MRN: [id number] ?    SURGICAL PROGRESS NOTE  ? ?Hospital Day(s): 3.  ? ?Interval History: Patient seen and examined, no acute events or new complaints overnight. Patient reports continued having right upper quadrant pain and nausea.  She endorses that the pain was aggravated when she was ambulating.  There was a area of redness in the right upper quadrant when she was ambulating but it was not present when she was laying down on my physical exam. ? ?Vital signs in last 24 hours: [min-max] current  ?Temp:  [97.9 ?F (36.6 ?C)-98.4 ?F (36.9 ?C)] 98.1 ?F (36.7 ?C) (04/08 0809) ?Pulse Rate:  [64-70] 70 (04/08 0809) ?Resp:  [18-20] 20 (04/08 0809) ?BP: (108-125)/(81-91) 112/81 (04/08 0809) ?SpO2:  [95 %-100 %] 100 % (04/08 0809)     Height: 5\' 7"  (170.2 cm) Weight: 74.8 kg BMI (Calculated): 25.82  ? ?Physical Exam:  ?Constitutional: alert, cooperative and no distress  ?Respiratory: breathing non-labored at rest  ?Cardiovascular: regular rate and sinus rhythm  ?Gastrointestinal: soft, non-tender, and non-distended.  Abdominal pain not aggravated by palpation but is present internally. ? ?Labs:  ? ?  Latest Ref Rng & Units 09/17/2021  ?  4:25 AM 09/16/2021  ?  6:02 AM 09/14/2021  ?  5:40 AM  ?CBC  ?WBC 4.0 - 10.5 K/uL 6.6   8.0   6.3    ?Hemoglobin 12.0 - 15.0 g/dL 11/14/2021   9.1   9.9    ?Hematocrit 36.0 - 46.0 % 34.1   29.2   30.9    ?Platelets 150 - 400 K/uL 348   297   276    ? ? ?  Latest Ref Rng & Units 09/17/2021  ?  4:25 AM 09/16/2021  ?  6:02 AM 09/15/2021  ?  5:13 AM  ?CMP  ?Glucose 70 - 99 mg/dL 11/15/2021   97   824    ?BUN 6 - 20 mg/dL 5   8   7     ?Creatinine 0.44 - 1.00 mg/dL 235     3.61    ?Sodium 135 - 145 mmol/L 141   138   137    ?Potassium 3.5 - 5.1 mmol/L 3.6   3.3   4.4    ?Chloride 98 - 111 mmol/L 109   105   106    ?CO2 22 - 32 mmol/L 26   25   24     ?Calcium 8.9 - 10.3 mg/dL 8.3   7.9   8.2    ?Total Protein 6.5 - 8.1 g/dL 6.5   5.5   6.4    ?Total  Bilirubin 0.3 - 1.2 mg/dL 1.3   1.2   1.4    ?Alkaline Phos 38 - 126 U/L 67   65   88    ?AST 15 - 41 U/L 23   19   31     ?ALT 0 - 44 U/L 33   34   54    ? ? ?Imaging studies: No new pertinent imaging studies ? ? ?Assessment/Plan:  ?26 y.o. female with choledocholithiasis 3 Days Post-Op s/p robot-assisted laparoscopic cholecystectomy. ? ?-Patient with persistent postoperative pain, nausea and vomiting ?-Labs shows normal white blood cell count and stable hemoglobin.  Lipase within normal limits.  Total bilirubin 1.3 slightly decreased from admission. ?-Due to the persistent right upper quadrant pain, nausea and vomiting I recommended CT scan of the abdomen and  pelvis for evaluation of any intra-abdominal complications.  If there is any sign of significant fluid collection HIDA scan will be indicated to rule out bile leak.  I will expect trace fluid on the liver bed or gallbladder fossa but not a big fluid collection. ?-Case discussed with hospitalist.  Appreciate hospitalist ordering CT scan.  I will follow closely. ? ?Wilmon Arms, MD ? ? ? ?

## 2021-09-18 ENCOUNTER — Inpatient Hospital Stay: Payer: 59

## 2021-09-18 DIAGNOSIS — R112 Nausea with vomiting, unspecified: Secondary | ICD-10-CM

## 2021-09-18 DIAGNOSIS — J45909 Unspecified asthma, uncomplicated: Secondary | ICD-10-CM | POA: Diagnosis not present

## 2021-09-18 DIAGNOSIS — I2699 Other pulmonary embolism without acute cor pulmonale: Secondary | ICD-10-CM | POA: Diagnosis not present

## 2021-09-18 DIAGNOSIS — E876 Hypokalemia: Secondary | ICD-10-CM

## 2021-09-18 DIAGNOSIS — F32A Depression, unspecified: Secondary | ICD-10-CM | POA: Diagnosis not present

## 2021-09-18 DIAGNOSIS — K8050 Calculus of bile duct without cholangitis or cholecystitis without obstruction: Secondary | ICD-10-CM | POA: Diagnosis not present

## 2021-09-18 LAB — BASIC METABOLIC PANEL
Anion gap: 5 (ref 5–15)
BUN: <5 mg/dL — ABNORMAL LOW (ref 6–20)
CO2: 28 mmol/L (ref 22–32)
Calcium: 8.2 mg/dL — ABNORMAL LOW (ref 8.9–10.3)
Chloride: 106 mmol/L (ref 98–111)
Creatinine, Ser: 0.96 mg/dL (ref 0.44–1.00)
GFR, Estimated: >60 mL/min (ref >60)
Glucose, Bld: 104 mg/dL — ABNORMAL HIGH (ref 70–99)
Potassium: 3.7 mmol/L (ref 3.5–5.1)
Sodium: 139 mmol/L (ref 135–145)

## 2021-09-18 LAB — CBC
HCT: 31 % — ABNORMAL LOW (ref 36.0–46.0)
Hemoglobin: 9.6 g/dL — ABNORMAL LOW (ref 12.0–15.0)
MCH: 27 pg (ref 26.0–34.0)
MCHC: 31 g/dL (ref 30.0–36.0)
MCV: 87.3 fL (ref 80.0–100.0)
Platelets: 313 10*3/uL (ref 150–400)
RBC: 3.55 MIL/uL — ABNORMAL LOW (ref 3.87–5.11)
RDW: 14.6 % (ref 11.5–15.5)
WBC: 7.4 10*3/uL (ref 4.0–10.5)
nRBC: 0 % (ref 0.0–0.2)

## 2021-09-18 LAB — HEPARIN LEVEL (UNFRACTIONATED): Heparin Unfractionated: 0.47 IU/mL (ref 0.30–0.70)

## 2021-09-18 LAB — LIPASE, BLOOD: Lipase: 33 U/L (ref 11–51)

## 2021-09-18 MED ORDER — RIVAROXABAN 20 MG PO TABS: 20.0000 mg | ORAL_TABLET | Freq: Every day | ORAL | Status: DC

## 2021-09-18 MED ORDER — SERTRALINE HCL 100 MG PO TABS
100.0000 mg | ORAL_TABLET | Freq: Every day | ORAL | Status: DC
  Administered 2021-09-19 – 2021-09-22 (×4): 100 mg via ORAL
  Filled 2021-09-18 (×4): qty 1

## 2021-09-18 MED ORDER — ALPRAZOLAM 0.5 MG PO TABS
0.2500 mg | ORAL_TABLET | Freq: Two times a day (BID) | ORAL | Status: DC | PRN
  Administered 2021-09-18 – 2021-09-22 (×6): 0.25 mg via ORAL
  Filled 2021-09-18 (×6): qty 1

## 2021-09-18 MED ORDER — RIVAROXABAN 15 MG PO TABS
15.0000 mg | ORAL_TABLET | Freq: Two times a day (BID) | ORAL | Status: DC
  Administered 2021-09-18 – 2021-09-22 (×10): 15 mg via ORAL
  Filled 2021-09-18 (×10): qty 1

## 2021-09-18 NOTE — Consult Note (Signed)
?Consult History and Physical  ? ?SERVICE: OB/GYN ? ?Patient Name: Kim Lynn ?Patient MRN:   [id number] ? ?CC: I have been made aware that Amiela feels that her Zoloft 50mg  is not working.  She reports more tearfulness. ? ?HPI: Kim Lynn is a 26 y.o. G1P1001 4 weeks PP with an NSVD on 08/16/21.  She was admitted with right upper quadrant pain.  She underwent a Robotic assisted cholecystectomy with ICG FireFly cholangiogram.  On 08/25/21 she was seen by her OB/GYN office and had an [place] score of 20.  She was started on Zoloft 50mg .  Today her Flavia Shipper score is 24.  ? ? ?Review of Systems: positives in bold ?GEN:   fevers, chills, weight changes, appetite changes, fatigue, night sweats ?HEENT:  HA, vision changes, hearing loss, congestion, rhinorrhea, sinus pressure, dysphagia ?CV:   CP, palpitations ?PULM:  SOB, cough ?GI:  abd pain, N/V/D/C ?GU:  dysuria, urgency, frequency ?MSK:  arthralgias, myalgias, back pain, swelling ?SKIN:  rashes, color changes, pallor ?NEURO:  numbness, weakness, tingling, seizures, dizziness, tremors ?PSYCH:  depression, anxiety, behavioral problems, confusion  ?HEME/LYMPH:  easy bruising or bleeding ?ENDO:  heat/cold intolerance ? ?Past Obstetrical History: ?OB History   ? ? Gravida  ?1  ? Para  ?1  ? Term  ?1  ? Preterm  ?   ? AB  ?   ? Living  ?1  ?  ? ? SAB  ?   ? IAB  ?   ? Ectopic  ?   ? Multiple  ?0  ? Live Births  ?1  ?   ?  ?  ? ? ?Past Medical History: ?Past Medical History:  ?Diagnosis Date  ? Anxiety   ? Rosanna Randy syndrome   ? ? ?Past Surgical History: ?  ?Past Surgical History:  ?Procedure Laterality Date  ? ERCP N/A 09/13/2021  ? Procedure: ENDOSCOPIC RETROGRADE CHOLANGIOPANCREATOGRAPHY (ERCP);  Surgeon: Lucilla Lame, MD;  Location: [facility] ENDOSCOPY;  Service: Endoscopy;  Laterality: N/A;  ? KNEE ARTHROSCOPY WITH ANTERIOR CRUCIATE LIGAMENT (ACL) REPAIR    ? TONSILLECTOMY    ? ? ?Family History:  ?family history is not on file. ? ?Social History:  ?Social History   ? ?Socioeconomic History  ? Marital status: Single  ?  Spouse name: Not on file  ? Number of children: Not on file  ? Years of education: Not on file  ? Highest education level: Not on file  ?Occupational History  ? Not on file  ?Tobacco Use  ? Smoking status: Every Day  ? Smokeless tobacco: Never  ?Vaping Use  ? Vaping Use: Every day  ?Substance and Sexual Activity  ? Alcohol use: Not Currently  ? Drug use: Never  ? Sexual activity: Not on file  ?Other Topics Concern  ? Not on file  ?Social History Narrative  ? Not on file  ? ?Social Determinants of Health  ? ?Financial Resource Strain: Not on file  ?Food Insecurity: Not on file  ?Transportation Needs: Not on file  ?Physical Activity: Not on file  ?Stress: Not on file  ?Social Connections: Not on file  ?Intimate Partner Violence: Not on file  ? ? ?Home Medications:  ?Medications reconciled in EPIC  ?No current facility-administered medications on file prior to encounter.  ? ?Current Outpatient Medications on File Prior to Encounter  ?Medication Sig Dispense Refill  ? ADVAIR DISKUS 250-50 MCG/ACT AEPB Inhale 1 puff into the lungs every 12 (twelve) hours.    ? sertraline (ZOLOFT)  50 MG tablet Take 50 mg by mouth daily.    ? Prenatal Vit-Fe Fumarate-FA (MULTIVITAMIN-PRENATAL) 27-0.8 MG TABS tablet Take 1 tablet by mouth daily at 12 noon. (Patient not taking: Reported on 09/13/2021)    ? ? ?Allergies:  ?Allergies  ?Allergen Reactions  ? Aspirin Other (See Comments)  ?  Liver disorder  ? Tylenol [Acetaminophen] Other (See Comments)  ?  Liver disorder  ? ? ?Physical Exam:  ?Temp:  [98.3 ?F (36.8 ?C)-98.6 ?F (37 ?C)] 98.5 ?F (36.9 ?C) (04/09 1155) ?Pulse Rate:  [57-97] 97 (04/09 1155) ?Resp:  [18-22] 22 (04/09 1155) ?BP: (106-136)/(78-89) 136/87 (04/09 1155) ?SpO2:  [99 %-100 %] 99 % (04/09 1155) ? ?Physical Exam ?Constitutional:   ?   Appearance: Normal appearance.  ?Pulmonary:  ?   Effort: Pulmonary effort is normal.  ?Neurological:  ?   General: No focal deficit  present.  ?   Mental Status: She is alert and oriented to person, place, and time.  ? ? ?Labs/Studies:  ? ?CBC and Coags:  ?Lab Results  ?Component Value Date  ? WBC 7.4 09/18/2021  ? NEUTOPHILPCT 78 09/13/2021  ? EOSPCT 3 09/13/2021  ? BASOPCT 0 09/13/2021  ? LYMPHOPCT 12 09/13/2021  ? HGB 9.6 (L) 09/18/2021  ? HCT 31.0 (L) 09/18/2021  ? MCV 87.3 09/18/2021  ? PLT 313 09/18/2021  ? ?CMP:  ?Lab Results  ?Component Value Date  ? NA 139 09/18/2021  ? K 3.7 09/18/2021  ? CL 106 09/18/2021  ? CO2 28 09/18/2021  ? BUN <5 (L) 09/18/2021  ? CREATININE 0.96 09/18/2021  ? CREATININE 0.85 09/17/2021  ? CREATININE 0.91 09/16/2021  ? PROT 6.5 09/17/2021  ? BILITOT 1.3 (H) 09/17/2021  ? ALT 33 09/17/2021  ? AST 23 09/17/2021  ? ALKPHOS 67 09/17/2021  ? ? ? ?Assessment / Plan:  ? ?Kim Lynn is a 26 y.o. G1P1001 4 weeks PP with an NSVD on 08/16/21.  She was admitted with right upper quadrant pain.  She underwent a Robotic assisted cholecystectomy with ICG FireFly cholangiogram. Currently experiencing under-treated PP depression. ? ?I reconmend the following: ?1. Increase Zoloft to 100mg  ?2. Psychiatry consult ?3. Add a TSH with next labs ? ? ?Thank you for the opportunity to be involved with this pt's care.   ?

## 2021-09-18 NOTE — Progress Notes (Signed)
?  Progress Note ? ? ?Patient: Kim Lynn EYC:[id number] DOB: April 27, 1996 DOA: 09/13/2021     4 ?DOS: the patient was seen and examined on 09/18/2021 at 9:20 AM ?  ? ? ? ?Brief hospital course: ?[name] is a 26 y.o. F with asthma, recent pregnancy who presented with 1 day RUQ pain and nausea. ? ?In the ER, LFTs elevated, imaging with CT and US showed dilated bile ducts.  MRCP confirmed small stone.   ? ? ?4/4: Admitted and taken for ERCP by Dr. Servando Snare for sphincterotomy, sludge in duct ?4/5: Cholecystectomy ?4/6-4/8: Persistent vomiting, inability to eat, CT angiogram shows small PE ? ? ? ? ?Assessment and Plan: ?Intractable nausea and vomiting ?Since cholecystectomy, patient has had 48 hours vomiting all solid or liquid intake.   ? ?She has had no fever, leukocytosis.  She has had no elevated lipase.   CT abdomen and pelvis with oral and IV contrast was obtained, that shows no pancreatitis, abdominal fluid collection, intra-abdominal free air.  There is been a decrease in size of the common bile duct and all findings on CT were expected postsurgical changes postcholecystectomy. ? ?-Continue IV fluids, ADAT ?- If not able to eat tomorrow, TPN ? ?- Continue analgesics and antiemetics ? ?Acute pulmonary embolism (HCC) ?Patient endorsed chest pain yesterday, D-dimer 2, CT angiogram obtained which showed small right lower lobe PE, only detected via AI. ? ?I think this may have been incidental, may also have been the cuase of her RUQ pain, I'm not sure there is a way to distinguish. ? ?- Check LE dopplers ?- Start Xarelto, would favor shorter course ? ?Choledocholithiasis ?Admitted and underwent ERCP and then cholecystectomy.  Resolved ? ?Hypokalemia ?Supplemented ? ?Normocytic anemia ?Likely expected after delivery  ?- Follow-up with PCP ? ?Asthma, chronic ?No evidence of flare ?-Continue ICS/LAMA ? ? ?Depression ?- Continue sertraline ? ? ? ? ? ? ? ? ? ?Subjective: Patient with persistent vomiting, no new confusion,  fever.  Still with persistent chest pain. ? ? ? ? ?Physical Exam: ?Vitals:  ? 09/17/21 1527 09/17/21 2010 09/18/21 0421 09/18/21 0752  ?BP: 117/74 106/78 118/89 119/80  ?Pulse: 81 70 84 (!) 57  ?Resp: 20 20 20 18   ?Temp: 98.8 ?F (37.1 ?C) 98.3 ?F (36.8 ?C) 98.4 ?F (36.9 ?C) 98.6 ?F (37 ?C)  ?TempSrc: Oral     ?SpO2: 98% 99% 100% 100%  ?Weight:      ?Height:      ? ?Adult female, lying in bed, tearful, on her computer, respiratory effort appears normal, vital signs normal.  Attention normal, speech fluent, face symmetric. ? ?Data Reviewed: ?Discussed with general surgery.  Nursing notes reviewed, vital signs reviewed. ?CT angiogram shows minimal PE, discussed with radiology. ?Basic metabolic panel lipase remain normal ? continues to self stable anemia. ? ?Family Communication: Husband at the bedside ? ? ? ?Disposition: ?Status is: Inpatient ?Patient still unable to tolerate oral intake. ? ?We will start anticoagulation by mouth, this can be continued as an outpatient basis. ? ?However she is still not able to tolerate anything by mouth, so we will continue IV fluids and attempt advance her diet.  If this is unsuccessful we will plan for TPN tomorrow ? ? ? ? ? ? ? ?Author: ?Cheree Ditto, MD ?09/18/2021 9:31 AM ? ?For on call review [email or web address].  ? ? ?

## 2021-09-18 NOTE — Assessment & Plan Note (Addendum)
Probably incidental finding.  Discussed with radiology, this was very small.  LE dopplers normal. ?- Continue new Xarelto, would favor shorter course ?

## 2021-09-18 NOTE — Progress Notes (Signed)
Transferring to Room 347.  Report given to [facility].  ?

## 2021-09-18 NOTE — Progress Notes (Signed)
Patient ID: Kim Lynn, female   DOB: 21-Jul-1995, 26 y.o.   MRN: [id number] ?    SURGICAL PROGRESS NOTE  ? ?Hospital Day(s): 4.  ? ?Interval History: Patient seen and examined, no acute events or new complaints overnight. Patient reports feeling concerned about not getting better.  Today she endorses that the abdominal pain is better but she has not been able to tolerate any liquids.  She continued to get nauseated with water.  She does endorse that she feels a little bit better after pain medications. ? ?Patient was found with a very small PE on the right lower lung.  Patient denies shortness of breath.  Patient does complain about mid chest pain but this seems to be not related to the very small PE. ? ?Vital signs in last 24 hours: [min-max] current  ?Temp:  [98.3 ?F (36.8 ?C)-98.8 ?F (37.1 ?C)] 98.6 ?F (37 ?C) (04/09 2376) ?Pulse Rate:  [57-84] 57 (04/09 0752) ?Resp:  [18-20] 18 (04/09 0752) ?BP: (106-119)/(74-89) 119/80 (04/09 0752) ?SpO2:  [98 %-100 %] 100 % (04/09 0752)     Height: 5\' 7"  (170.2 cm) Weight: 74.8 kg BMI (Calculated): 25.82  ? ?Physical Exam:  ?Constitutional: alert, cooperative and no distress  ?Respiratory: breathing non-labored at rest  ?Cardiovascular: regular rate and sinus rhythm  ?Gastrointestinal: soft, non-tender, and non-distended.  Wounds are dry and clean ? ?Labs:  ? ?  Latest Ref Rng & Units 09/18/2021  ?  1:50 AM 09/17/2021  ?  4:25 AM 09/16/2021  ?  6:02 AM  ?CBC  ?WBC 4.0 - 10.5 K/uL 7.4   6.6   8.0    ?Hemoglobin 12.0 - 15.0 g/dL 9.6   11/16/2021   9.1    ?Hematocrit 36.0 - 46.0 % 31.0   34.1   29.2    ?Platelets 150 - 400 K/uL 313   348   297    ? ? ?  Latest Ref Rng & Units 09/18/2021  ?  1:50 AM 09/17/2021  ?  4:25 AM 09/16/2021  ?  6:02 AM  ?CMP  ?Glucose 70 - 99 mg/dL 11/16/2021   151   97    ?BUN 6 - 20 mg/dL 5   5   8     ?Creatinine 0.44 - 1.00 mg/dL 761   6.07   3.71    ?Sodium 135 - 145 mmol/L 139   141   138    ?Potassium 3.5 - 5.1 mmol/L 3.7   3.6   3.3    ?Chloride 98 - 111 mmol/L 106    109   105    ?CO2 22 - 32 mmol/L 28   26   25     ?Calcium 8.9 - 10.3 mg/dL 8.2   8.3   7.9    ?Total Protein 6.5 - 8.1 g/dL  6.5   5.5    ?Total Bilirubin 0.3 - 1.2 mg/dL  1.3   1.2    ?Alkaline Phos 38 - 126 U/L  67   65    ?AST 15 - 41 U/L  23   19    ?ALT 0 - 44 U/L  33   34    ? ? ?Imaging studies: I personally evaluated the CT scan of the abdomen and pelvis yesterday.  There was no sign of fluid or intra-abdominal complications.  I also evaluated the CT of the chest with new finding of small PE. ? ? ?Assessment/Plan:  ?26 y.o. female with choledocholithiasis 4 Days Post-Op  s/p robot-assisted laparoscopic cholecystectomy. ?  ?-Patient with persistent postoperative pain, nausea and vomiting ?-Labs shows normal white blood cell count and stable hemoglobin.  Lipase within normal limits.   ?-CT scan of the abdomen yesterday shows no intra-abdominal complications.  With normal labs and no finding on CT I would not recommend to do HIDA scan at this moment. ?-CT of the chest shows very small PE.  Patient started on anticoagulation.  No contraindication for anticoagulation.  I agree with hospitalist that this very small PE on the right side could be incidental and does not explain patient's persistent nausea and vomiting. ?-Patient has had very complicated to previous month with recent delivery, choledocholithiasis with ERCP and cholecystectomy. ?-Even though we would have expected patient to be recovered at this point I do think that there is any complications at this moment.  Patient just taking longer to recover with prolonged postoperative nausea and vomiting. ?-I agree with hospitalist to try to eat at this moment after she takes the pain medication on initial medication where she feels better to see if she can keep small sips of full liquids. ?-Patient is ambulating adequately ?-We will continue to follow closely ? ?Wilmon Arms, MD ? ? ? ?

## 2021-09-18 NOTE — Consult Note (Signed)
ANTICOAGULATION CONSULT NOTE - Initial Consult ? ?Pharmacy Consult for IV heparin drip ?Indication: pulmonary embolus ? ?Allergies  ?Allergen Reactions  ? Aspirin Other (See Comments)  ?  Liver disorder  ? Tylenol [Acetaminophen] Other (See Comments)  ?  Liver disorder  ? ? ?Patient Measurements: ?Height: 5\' 7"  (170.2 cm) ?Weight: 74.8 kg (164 lb 14.5 oz) ?IBW/kg (Calculated) : 61.6 ?Heparin Dosing Weight: 74.8kg ? ?Vital Signs: ?Temp: 98.4 ?F (36.9 ?C) (04/09 0421) ?BP: 118/89 (04/09 0421) ?Pulse Rate: 84 (04/09 0421) ? ?Labs: ?Recent Labs  ?  09/16/21 ?0602 09/17/21 ?0425 09/17/21 ?2126 09/18/21 ?0150  ?HGB 9.1* 10.6*  --  9.6*  ?HCT 29.2* 34.1*  --  31.0*  ?PLT 297 348  --  313  ?APTT  --   --  30  --   ?HEPARINUNFRC  --   --   --  0.47  ?CREATININE 0.91 0.85  --  0.96  ? ? ? ?Estimated Creatinine Clearance: 96.3 mL/min (by C-G formula based on SCr of 0.96 mg/dL). ? ? ?Medical History: ?Past Medical History:  ?Diagnosis Date  ? Anxiety   ? 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 ? ?Medications:  ?PTA: No AC/APT prior to admission ?Inpatient: Heparin drip 4/8 >>> ?Allergies: Reported "liver disorder" with aspirin ? ? ?Assessment: ?26 y.o. F with asthma, recent pregnancy who presented with 1 day RUQ pain and nausea. CT Angio Chest done 4/8 @ 2026 reports "Minimal pulmonary emboli in the right lower lobe posteriorly." Pharmacy consulted for management of heparin drip inpatient in the setting of pulmonary embolism.  ? ?Baseline Labs: ?aPTT - ordered ?INR -  ?Hgb - 10.6 ?Plts - 348 ? ? ?Date Time aPTT/HL Rate/Comment ?4/09 0150 -- / 0.47 Therapeutic x 1 ? ? ?Goal of Therapy:  ?Heparin level 0.3-0.7 units/ml ?Monitor platelets by anticoagulation protocol: Yes ?  ?Plan:  ?Continue heparin infusion at 1250 units/hr ?Recheck HL in 6 hours to confirm  ?Continue to monitor H&H and platelets daily while on heparin gtt. ?    ?Thank you for allowing pharmacy to be a part of this patient?s care. ? ?6/09, PharmD, MBA ?09/18/2021 ?5:31  AM ? ? ? ? ?

## 2021-09-19 DIAGNOSIS — J45909 Unspecified asthma, uncomplicated: Secondary | ICD-10-CM | POA: Diagnosis not present

## 2021-09-19 DIAGNOSIS — F32A Depression, unspecified: Secondary | ICD-10-CM | POA: Diagnosis not present

## 2021-09-19 DIAGNOSIS — F53 Postpartum depression: Secondary | ICD-10-CM | POA: Diagnosis not present

## 2021-09-19 DIAGNOSIS — D649 Anemia, unspecified: Secondary | ICD-10-CM | POA: Diagnosis not present

## 2021-09-19 DIAGNOSIS — K8050 Calculus of bile duct without cholangitis or cholecystitis without obstruction: Secondary | ICD-10-CM | POA: Diagnosis not present

## 2021-09-19 LAB — BASIC METABOLIC PANEL
Anion gap: 5 (ref 5–15)
BUN: <5 mg/dL — ABNORMAL LOW (ref 6–20)
CO2: 27 mmol/L (ref 22–32)
Calcium: 8.4 mg/dL — ABNORMAL LOW (ref 8.9–10.3)
Chloride: 107 mmol/L (ref 98–111)
Creatinine, Ser: 0.96 mg/dL (ref 0.44–1.00)
GFR, Estimated: >60 mL/min (ref >60)
Glucose, Bld: 109 mg/dL — ABNORMAL HIGH (ref 70–99)
Potassium: 3.8 mmol/L (ref 3.5–5.1)
Sodium: 139 mmol/L (ref 135–145)

## 2021-09-19 LAB — CBC
HCT: 32.8 % — ABNORMAL LOW (ref 36.0–46.0)
Hemoglobin: 10.3 g/dL — ABNORMAL LOW (ref 12.0–15.0)
MCH: 27 pg (ref 26.0–34.0)
MCHC: 31.4 g/dL (ref 30.0–36.0)
MCV: 86.1 fL (ref 80.0–100.0)
Platelets: 324 10*3/uL (ref 150–400)
RBC: 3.81 MIL/uL — ABNORMAL LOW (ref 3.87–5.11)
RDW: 14.4 % (ref 11.5–15.5)
WBC: 5.4 10*3/uL (ref 4.0–10.5)
nRBC: 0 % (ref 0.0–0.2)

## 2021-09-19 LAB — URINE CULTURE: Culture: <10000 — AB

## 2021-09-19 LAB — TSH: TSH: 4.164 u[IU]/mL (ref 0.350–4.500)

## 2021-09-19 MED ORDER — COLESTIPOL HCL 1 G PO TABS
2.0000 g | ORAL_TABLET | Freq: Two times a day (BID) | ORAL | Status: DC
  Administered 2021-09-19 – 2021-09-21 (×4): 2 g via ORAL
  Filled 2021-09-19 (×5): qty 2

## 2021-09-19 MED ORDER — PROMETHAZINE HCL 25 MG PO TABS
12.5000 mg | ORAL_TABLET | Freq: Four times a day (QID) | ORAL | Status: DC | PRN
  Administered 2021-09-19 – 2021-09-20 (×2): 12.5 mg via ORAL
  Filled 2021-09-19 (×2): qty 1

## 2021-09-19 NOTE — Consult Note (Addendum)
[facility] Face-to-Face Psychiatry Consult  ? ?Reason for Consult: Anxiety/depression ?Referring Physician: Maryfrances Bunnell ?Patient Identification: Kim Lynn ?MRN:  [id number] ?Principal Diagnosis: Depression ?Diagnosis:  Principal Problem: ?  Depression ?Active Problems: ?  Choledocholithiasis ?  Asthma, chronic ?  Normocytic anemia ?  Hypokalemia ?  Intractable nausea and vomiting ?  Acute pulmonary embolism (HCC) ? ? ?Total Time spent with patient: 1 hour ? ?Subjective: "I have always felt like I am a bother to people and I apologize for everything." ?Kim Lynn is a 26 y.o. female patient admitted with Choledocholithiasis.  Consult for depression postpartum 1 month ? ?HPI: Patient seen and chart reviewed.  Patient was sleepy upon arrival to her room.  She was able to wake up and participate in evaluation.  She states that she has always "apologized for everything."  Patient delivered a baby girl, Aspen, about 1 month ago.  She stated that she went to her OB 2 weeks ago and asked for medication for anxiety and depression.  She was put on sertraline, currently at 100 mg.  Now she was admitted for medical issues as described above.  She has gone through some difficult times and is currently still nauseous and vomits solids.  She is tearful during this interview.  She states that baby had some issues at birth and had to have her tongue "clipped."  She never did latch onto breast feeding, so patient has been trying to pump and feed her bottle but that was not enough.  She is giving her formula now but still trying to pump while she is in the hospital.  This provider discussed at length with patient the stresses that she has been under, including her medical, baby's medical conditions and postpartum depression and anxiety. ?Patient states that she did see a psychiatrist as a young child, teenager.  When she was in high school she was put on some antidepression medication that she does not remember and some BuSpar that she  said she did not tolerate well.  She reports no past suicide attempts, although she had nonsuicidal self-injurious behavior as a teenager.  Patient states that she occasionally uses marijuana.  Denies any other illicit drug use or alcohol use. ?Discussed with patient that medication change for her depression may not necessarily be indicated at this point, but provider will contact patient's midwife, as patient's bedside nurse indicated that the midwife may want to change her to Lexapro. ? ?Past Psychiatric History: Depression and anxiety as a teenager ? ?Risk to Self:   ?Risk to Others:   ?Prior Inpatient Therapy:   ?Prior Outpatient Therapy:   ? ?Past Medical History:  ?Past Medical History:  ?Diagnosis Date  ? Anxiety   ? Sullivan Lone syndrome   ?  ?Past Surgical History:  ?Procedure Laterality Date  ? ERCP N/A 09/13/2021  ? Procedure: ENDOSCOPIC RETROGRADE CHOLANGIOPANCREATOGRAPHY (ERCP);  Surgeon: Midge Minium, MD;  Location: [facility] ENDOSCOPY;  Service: Endoscopy;  Laterality: N/A;  ? KNEE ARTHROSCOPY WITH ANTERIOR CRUCIATE LIGAMENT (ACL) REPAIR    ? TONSILLECTOMY    ? ?Family History: History reviewed. No pertinent family history. ?Family Psychiatric  History: None reported ?Social History:  ?Social History  ? ?Substance and Sexual Activity  ?Alcohol Use Not Currently  ?   ?Social History  ? ?Substance and Sexual Activity  ?Drug Use Never  ?  ?Social History  ? ?Socioeconomic History  ? Marital status: Single  ?  Spouse name: Not on file  ? Number of children: Not on  file  ? Years of education: Not on file  ? Highest education level: Not on file  ?Occupational History  ? Not on file  ?Tobacco Use  ? Smoking status: Every Day  ? Smokeless tobacco: Never  ?Vaping Use  ? Vaping Use: Every day  ?Substance and Sexual Activity  ? Alcohol use: Not Currently  ? Drug use: Never  ? Sexual activity: Not on file  ?Other Topics Concern  ? Not on file  ?Social History Narrative  ? Not on file  ? ?Social Determinants of Health   ? ?Financial Resource Strain: Not on file  ?Food Insecurity: Not on file  ?Transportation Needs: Not on file  ?Physical Activity: Not on file  ?Stress: Not on file  ?Social Connections: Not on file  ? ?Additional Social History: ?  ? ?Allergies:   ?Allergies  ?Allergen Reactions  ? Aspirin Other (See Comments)  ?  Liver disorder  ? Tylenol [Acetaminophen] Other (See Comments)  ?  Liver disorder  ? ? ?Labs:  ?Results for orders placed or performed during the hospital encounter of 09/13/21 (from the past 48 hour(s))  ?APTT     Status: None  ? Collection Time: 09/17/21  9:26 PM  ?Result Value Ref Range  ? aPTT 30 24 - 36 seconds  ?  Comment: Performed at [facility], [address] [place], [place] [address]  ?Basic metabolic panel     Status: Abnormal  ? Collection Time: 09/18/21  1:50 AM  ?Result Value Ref Range  ? Sodium 139 135 - 145 mmol/L  ? Potassium 3.7 3.5 - 5.1 mmol/L  ? Chloride 106 98 - 111 mmol/L  ? CO2 28 22 - 32 mmol/L  ? Glucose, Bld 104 (H) 70 - 99 mg/dL  ?  Comment: Glucose reference range applies only to samples taken after fasting for at least 8 hours.  ? BUN <5 (L) 6 - 20 mg/dL  ? Creatinine, Ser 0.96 0.44 - 1.00 mg/dL  ? Calcium 8.2 (L) 8.9 - 10.3 mg/dL  ? GFR, Estimated >60 >60 mL/min  ?  Comment: (NOTE) ?Calculated using the CKD-EPI Creatinine Equation (2021) ?  ? Anion gap 5 5 - 15  ?  Comment: Performed at [facility], [address], [place], [place] [address]  ?CBC     Status: Abnormal  ? Collection Time: 09/18/21  1:50 AM  ?Result Value Ref Range  ? WBC 7.4 4.0 - 10.5 K/uL  ? RBC 3.55 (L) 3.87 - 5.11 MIL/uL  ? Hemoglobin 9.6 (L) 12.0 - 15.0 g/dL  ? HCT 31.0 (L) 36.0 - 46.0 %  ? MCV 87.3 80.0 - 100.0 fL  ? MCH 27.0 26.0 - 34.0 pg  ? MCHC 31.0 30.0 - 36.0 g/dL  ? RDW 14.6 11.5 - 15.5 %  ? Platelets 313 150 - 400 K/uL  ? nRBC 0.0 0.0 - 0.2 %  ?  Comment: Performed at [facility], [address] [place], [place] [address]  ?Lipase, blood     Status: None  ?  Collection Time: 09/18/21  1:50 AM  ?Result Value Ref Range  ? Lipase 33 11 - 51 U/L  ?  Comment: Performed at [facility], [address] [place], [place] [address]  ?Heparin level (unfractionated)     Status: None  ? Collection Time: 09/18/21  1:50 AM  ?Result Value Ref Range  ? Heparin Unfractionated 0.47 0.30 - 0.70 IU/mL  ?  Comment: (NOTE) ?The clinical reportable  range upper limit is being lowered to >1.10 ?to align with the FDA approved guidance for the current laboratory ?assay. ? ?If heparin results are below expected values, and patient dosage has  ?been confirmed, suggest follow up testing of antithrombin III levels. ?Performed at [facility], [address] [place], ?[place] [id number] ?  ?CBC     Status: Abnormal  ? Collection Time: 09/19/21  7:06 AM  ?Result Value Ref Range  ? WBC 5.4 4.0 - 10.5 K/uL  ? RBC 3.81 (L) 3.87 - 5.11 MIL/uL  ? Hemoglobin 10.3 (L) 12.0 - 15.0 g/dL  ? HCT 32.8 (L) 36.0 - 46.0 %  ? MCV 86.1 80.0 - 100.0 fL  ? MCH 27.0 26.0 - 34.0 pg  ? MCHC 31.4 30.0 - 36.0 g/dL  ? RDW 14.4 11.5 - 15.5 %  ? Platelets 324 150 - 400 K/uL  ? nRBC 0.0 0.0 - 0.2 %  ?  Comment: Performed at [facility], [address] [place], [place] [address]  ?Basic metabolic panel     Status: Abnormal  ? Collection Time: 09/19/21  7:06 AM  ?Result Value Ref Range  ? Sodium 139 135 - 145 mmol/L  ? Potassium 3.8 3.5 - 5.1 mmol/L  ? Chloride 107 98 - 111 mmol/L  ? CO2 27 22 - 32 mmol/L  ? Glucose, Bld 109 (H) 70 - 99 mg/dL  ?  Comment: Glucose reference range applies only to samples taken after fasting for at least 8 hours.  ? BUN <5 (L) 6 - 20 mg/dL  ? Creatinine, Ser 0.96 0.44 - 1.00 mg/dL  ? Calcium 8.4 (L) 8.9 - 10.3 mg/dL  ? GFR, Estimated >60 >60 mL/min  ?  Comment: (NOTE) ?Calculated using the CKD-EPI Creatinine Equation (2021) ?  ? Anion gap 5 5 - 15  ?  Comment: Performed at [facility], [address] [place], [place] [address]  ?TSH     Status: None  ?  Collection Time: 09/19/21  7:06 AM  ?Result Value Ref Range  ? TSH 4.164 0.350 - 4.500 uIU/mL  ?  Comment: Performed by a 3rd Generation assay with a functional sensitivity of <=0.01 uIU/mL. ?Performed at [facility]

## 2021-09-19 NOTE — Progress Notes (Signed)
?  Progress Note ? ? ?Patient: Kim Lynn YDX:[id number] DOB: June 12, 1996 DOA: 09/13/2021     5 ?DOS: the patient was seen and examined on 09/19/2021 at 10:39 AM ?  ? ? ? ?Brief hospital course: ?Mrs. Fate is a 26 y.o. F with asthma, recent pregnancy who presented with 1 day RUQ pain and nausea. ? ?In the ER, LFTs elevated, imaging with CT and US showed dilated bile ducts.  MRCP confirmed small stone.   ? ? ?4/4: Admitted and taken for ERCP by Dr. Servando Snare for sphincterotomy, sludge in duct ?4/5: Cholecystectomy ?4/6-4/10: Persistent vomiting, inability to eat, CT angiogram shows small PE ? ? ? ? ?Assessment and Plan: ?* Intractable nausea and vomiting ?- Start Colestipol ?-Continue IV fluids ?- change to phenergan ?- TPN if no improvement tomrorow ? ? ? ?Acute pulmonary embolism (HCC) ?[place] legs normal.   ?-Continue xarelto ? ? ?Asthma, chronic ?No evidence of flare ?-Continue ICS/LABA ? ? ?Depression ?Patient with substantial post-partum depression.  I would favor not switching SSRIs as she has really just started sertraline.  Judicious BZDs are probably helpful in the most acute phase of this.  ?- Continue sertraline ? ? ? ? ? ? ? ?Subjective: Still vomiting and unable to take down anything, no new fever, significant symptom, ? ? ? ? ?Physical Exam: ?Vitals:  ? 09/19/21 0319 09/19/21 0745 09/19/21 1245 09/19/21 1626  ?BP: 108/63 101/77 120/80 114/60  ?Pulse: 85 75 77 61  ?Resp: 20 18 16 16   ?Temp: 98.6 ?F (37 ?C) 98.5 ?F (36.9 ?C) 98.7 ?F (37.1 ?C) 98.5 ?F (36.9 ?C)  ?TempSrc: Oral Oral Oral Oral  ?SpO2: 93% 96% 97% 98%  ?Weight:      ?Height:      ? ? ?Adult female, lying in bed, using her phone, no obvious distress.  RRR, no murmurs, respiratory effort normal, abdominal exam notable for right-sided tenderness, attention normal, speech fluent, face symmetric, affect blunted. ? ? ? ? ?Data Reviewed: ?Discussed with general surgery and psychiatry.  Nursing notes reviewed, vital signs reviewed. ?Basic metabolic panel and  complete blood counts unremarkable. ? ?Family Communication: Husband at the bedside ? ? ? ?Disposition: ?Status is: Inpatient ?Patient still unable to tolerate oral intake. ?  ? ? ? ? ? ? ? ?Author: ? , MD ?09/19/2021 6:52 PM ? ?For on call review [email or web address].  ? ? ?

## 2021-09-19 NOTE — Progress Notes (Signed)
[facility] ?SURGICAL PROGRESS NOTE ? ?Hospital Day(s): 5.  ? ?Post op day(s): 5 Days Post-Op.  ? ?Interval History:  ?Patient seen and examined ?No acute events or new complaints overnight.  ?Patient reports she continues to have intractable nausea/emesis with eating ?She tried grits and ice cream last night and continued to have emesis; she tried pre-medicating with antiemetics without improvement ?Still with incisional soreness; but this is improved ?No fever, chills, CP, SOB ?She continues to remain without a leukocytosis; WBC 5.4K ?Renal function remains normal; sCr - 0.96; UO - 2000 ccs ?No electrolyte derangements  ?She is on FLD; not tolerating ?She has been ambulating   ? ?Vital signs in last 24 hours: [min-max] current  ?Temp:  [97.9 ?F (36.6 ?C)-98.7 ?F (37.1 ?C)] 98.6 ?F (37 ?C) (04/10 0319) ?Pulse Rate:  [57-97] 85 (04/10 0319) ?Resp:  [18-22] 20 (04/10 0319) ?BP: (108-136)/(63-87) 108/63 (04/10 0319) ?SpO2:  [93 %-100 %] 93 % (04/10 0319)     Height: 5\' 7"  (170.2 cm) Weight: 74.8 kg BMI (Calculated): 25.82  ? ?Intake/Output last 2 shifts:  ?04/09 0701 - 04/10 0700 ?In: 1239.6 [I.V.:1239.6] ?Out: 2500 [Urine:2000; Emesis/NG output:500]  ? ?Physical Exam:  ?Constitutional: alert, cooperative and no distress; tearful this morning  ?Respiratory: breathing non-labored at rest  ?Cardiovascular: regular rate and sinus rhythm  ?Gastrointestinal: Soft, incisional soreness, non-distended, no rebound/guarding ?Integumentary: Laparoscopic incisions are CDI with dermabond, no erythema or drainage  ? ?Labs:  ? ?  Latest Ref Rng & Units 09/19/2021  ?  7:06 AM 09/18/2021  ?  1:50 AM 09/17/2021  ?  4:25 AM  ?CBC  ?WBC 4.0 - 10.5 K/uL 5.4   7.4   6.6    ?Hemoglobin 12.0 - 15.0 g/dL 10.3   9.6   10.6    ?Hematocrit 36.0 - 46.0 % 32.8   31.0   34.1    ?Platelets 150 - 400 K/uL 324   313   348    ? ? ?  Latest Ref Rng & Units 09/18/2021  ?  1:50 AM 09/17/2021  ?  4:25 AM 09/16/2021  ?  6:02 AM  ?CMP  ?Glucose 70 - 99  mg/dL 104   102   97    ?BUN 6 - 20 mg/dL 5   5   8     ?Creatinine 0.44 - 1.00 mg/dL 0.96   0.85   0.91    ?Sodium 135 - 145 mmol/L 139   141   138    ?Potassium 3.5 - 5.1 mmol/L 3.7   3.6   3.3    ?Chloride 98 - 111 mmol/L 106   109   105    ?CO2 22 - 32 mmol/L 28   26   25     ?Calcium 8.9 - 10.3 mg/dL 8.2   8.3   7.9    ?Total Protein 6.5 - 8.1 g/dL  6.5   5.5    ?Total Bilirubin 0.3 - 1.2 mg/dL  1.3   1.2    ?Alkaline Phos 38 - 126 U/L  67   65    ?AST 15 - 41 U/L  23   19    ?ALT 0 - 44 U/L  33   34    ? ? ? ?Imaging studies: No new pertinent imaging studies ? ? ?Assessment/Plan:  ?26 y.o. female 5 Days Post-Op s/p robotic assisted laparoscopic cholecystectomy for choledocholithiasis, complicated by intractable nausea/emesis and small RLL PE ? ? - Continue FLD for  now; encouraged her to continue to try pre-medication with antiemetics; I will add trial of phenergan (I did review this medication and breast feeding, it appears short term use dose not pose risk to breast fed infants) ? - Agree we need to consider TPN given her poor PO intake. Would like to try to avoid this if possible. ? - She is not behaving like bile leak and her laboratory work up is reassuring; however, I wonder if it may be worthwhile to obtain HIDA to be certain she has no biliary leak.  ?- Continue IVF for now until able to tolerate PO ?- Monitor abdominal examination; on-going bowel function ?- Pain control prn; antiemetics prn  ?           - Mobilization as tolerated  ?           - Further management per primary service  ? ?All of the above findings and recommendations were discussed with the patient, and the medical team, and all of patient's questions were answered to her expressed satisfaction. ? ?-- ?Edison Simon, PA-C ?[facility] ?09/19/2021, 7:35 AM ?[phone] ?M-F: 7am - 4pm ? ?

## 2021-09-20 ENCOUNTER — Inpatient Hospital Stay: Payer: 59

## 2021-09-20 DIAGNOSIS — J45909 Unspecified asthma, uncomplicated: Secondary | ICD-10-CM | POA: Diagnosis not present

## 2021-09-20 DIAGNOSIS — K8050 Calculus of bile duct without cholangitis or cholecystitis without obstruction: Secondary | ICD-10-CM | POA: Diagnosis not present

## 2021-09-20 DIAGNOSIS — F32A Depression, unspecified: Secondary | ICD-10-CM | POA: Diagnosis not present

## 2021-09-20 DIAGNOSIS — D649 Anemia, unspecified: Secondary | ICD-10-CM | POA: Diagnosis not present

## 2021-09-20 LAB — COMPREHENSIVE METABOLIC PANEL
ALT: 30 U/L (ref 0–44)
AST: 20 U/L (ref 15–41)
Albumin: 3.2 g/dL — ABNORMAL LOW (ref 3.5–5.0)
Alkaline Phosphatase: 63 U/L (ref 38–126)
Anion gap: 4 — ABNORMAL LOW (ref 5–15)
BUN: <5 mg/dL — ABNORMAL LOW (ref 6–20)
CO2: 29 mmol/L (ref 22–32)
Calcium: 8.4 mg/dL — ABNORMAL LOW (ref 8.9–10.3)
Chloride: 107 mmol/L (ref 98–111)
Creatinine, Ser: 0.89 mg/dL (ref 0.44–1.00)
GFR, Estimated: >60 mL/min (ref >60)
Glucose, Bld: 104 mg/dL — ABNORMAL HIGH (ref 70–99)
Potassium: 4 mmol/L (ref 3.5–5.1)
Sodium: 140 mmol/L (ref 135–145)
Total Bilirubin: 1.5 mg/dL — ABNORMAL HIGH (ref 0.3–1.2)
Total Protein: 6.2 g/dL — ABNORMAL LOW (ref 6.5–8.1)

## 2021-09-20 LAB — CBC
HCT: 32.9 % — ABNORMAL LOW (ref 36.0–46.0)
Hemoglobin: 10.1 g/dL — ABNORMAL LOW (ref 12.0–15.0)
MCH: 27 pg (ref 26.0–34.0)
MCHC: 30.7 g/dL (ref 30.0–36.0)
MCV: 88 fL (ref 80.0–100.0)
Platelets: 349 10*3/uL (ref 150–400)
RBC: 3.74 MIL/uL — ABNORMAL LOW (ref 3.87–5.11)
RDW: 14.5 % (ref 11.5–15.5)
WBC: 6.1 10*3/uL (ref 4.0–10.5)
nRBC: 0 % (ref 0.0–0.2)

## 2021-09-20 MED ORDER — TECHNETIUM TC 99M MEBROFENIN IV KIT
5.0000 | PACK | Freq: Once | INTRAVENOUS | Status: AC | PRN
  Administered 2021-09-20: 5.05 via INTRAVENOUS

## 2021-09-20 MED ORDER — PROMETHAZINE HCL 25 MG PO TABS: 25.0000 mg | ORAL_TABLET | Freq: Four times a day (QID) | ORAL | Status: DC | PRN

## 2021-09-20 MED ORDER — HYDROMORPHONE HCL 2 MG PO TABS
2.0000 mg | ORAL_TABLET | Freq: Four times a day (QID) | ORAL | Status: DC | PRN
  Administered 2021-09-20 – 2021-09-22 (×6): 2 mg via ORAL
  Filled 2021-09-20 (×7): qty 1

## 2021-09-20 MED ORDER — PHENOL 1.4 % MT LIQD
1.0000 | OROMUCOSAL | Status: DC | PRN
  Filled 2021-09-20: qty 177

## 2021-09-20 NOTE — Progress Notes (Signed)
[facility] ?SURGICAL PROGRESS NOTE ? ?Hospital Day(s): 6.  ? ?Post op day(s): 6 Days Post-Op.  ? ?Interval History:  ?Patient seen and examined ?No acute events or new complaints overnight.  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 ?Vital signs in last 24 hours: [min-max] current  ?Temp:  [97.5 ?F (36.4 ?C)-98.7 ?F (37.1 ?C)] 98 ?F (36.7 ?C) (04/11 7681) ?Pulse Rate:  [61-86] 77 (04/11 0823) ?Resp:  [16-18] 18 (04/11 1572) ?BP: (95-120)/(58-87) 106/78 (04/11 6203) ?SpO2:  [94 %-99 %] 96 % (04/11 0823)     Height: 5\' 7"  (170.2 cm) Weight: 74.8 kg BMI (Calculated): 25.82  ? ?Intake/Output last 2 shifts:  ?04/10 0701 - 04/11 0700 ?In: 987 [I.V.:987] ?Out: 2875 [Urine:2500; Emesis/NG output:375]  ? ?Physical Exam:  ?Constitutional: alert, actively throwing up, still tearful, certainly appears clinically depressed ?Respiratory: breathing non-labored at rest  ?Cardiovascular: regular rate and sinus rhythm  ?Gastrointestinal: Soft, incisional soreness, non-distended, no rebound/guarding ?Integumentary: Laparoscopic incisions are CDI with dermabond, no erythema or drainage  ? ?Labs:  ? ?  Latest Ref Rng & Units 09/20/2021  ?  5:25 AM 09/19/2021  ?  7:06 AM 09/18/2021  ?  1:50 AM  ?CBC  ?WBC 4.0 - 10.5 K/uL 6.1   5.4   7.4    ?Hemoglobin 12.0 - 15.0 g/dL 11/18/2021   55.9   9.6    ?Hematocrit 36.0 - 46.0 % 32.9   32.8   31.0    ?Platelets 150 - 400 K/uL 349   324   313    ? ? ?  Latest Ref Rng & Units 09/20/2021  ?  5:25 AM 09/19/2021  ?  7:06 AM 09/18/2021  ?  1:50 AM  ?CMP  ?Glucose 70 - 99 mg/dL 11/18/2021   638   453    ?BUN 6 - 20 mg/dL <5   <5   <5    ?Creatinine 0.44 - 1.00 mg/dL 646   8.03   2.12    ?Sodium 135 - 145  mmol/L 140   139   139    ?Potassium 3.5 - 5.1 mmol/L 4.0   3.8   3.7    ?Chloride 98 - 111 mmol/L 107   107   106    ?CO2 22 - 32 mmol/L 29   27   28     ?Calcium 8.9 - 10.3 mg/dL 8.4   8.4   8.2    ?Total Protein 6.5 - 8.1 g/dL 6.2      ?Total Bilirubin 0.3 - 1.2 mg/dL 1.5      ?Alkaline Phos 38 - 126 U/L 63      ?AST 15 - 41 U/L 20      ?ALT 0 - 44 U/L 30      ? ? ? ?Imaging studies: No new pertinent imaging studies ? ? ?Assessment/Plan: ?26 y.o. female 6 Days Post-Op s/p robotic assisted laparoscopic cholecystectomy for choledocholithiasis, complicated by intractable nausea/emesis and small RLL PE ? ? - We will go ahead and get HIDA this morning to be certain she does not have a biliary leak. She does have mild hyperbilirubinemia today to 1.5 but this is not drastically higher than any previous draws post-op and  she carries a Dx of Gilbert's. If HIDA is normal; may be worthwhile to get GI input regarding intractable N/V.  ?  ? - I have made her NPO; I do think we need to proceed with TPN and PICC at this point.   ?- Continue IVF for now until able to tolerate PO ?- Monitor abdominal examination; on-going bowel function ?- Pain control prn (will need to hold narcotics) ?- Antiemetics prn (will increase phenergan) ?           - Mobilization as tolerated  ?           - Further management per primary service   ? - Appreciate consulting service; I wonder how much of a factor post-partum depression is at this point as a contributor to her symptoms.  ? ?All of the above findings and recommendations were discussed with the patient, and the medical team, and all of patient's questions were answered to her expressed satisfaction. ? ?-- ?Lynden Oxford, PA-C ?[facility] ?09/20/2021, 9:33 AM ?M-F: 7am - 4pm ? ?

## 2021-09-20 NOTE — Progress Notes (Signed)
?Progress Note ? ? ?Patient: Kim Lynn FYB:[id number] DOB: 05/12/1996 DOA: 09/13/2021     6 ?DOS: the patient was seen and examined on 09/20/2021 at 1:26PM ?  ? ? ? ? ? ?Brief hospital course: ?Kim Lynn is a 26 y.o. F with asthma, recent pregnancy who presented with 1 day RUQ pain and nausea. ? ?In the ER, LFTs elevated, imaging with CT and US showed dilated bile ducts.  MRCP confirmed small stone.   ? ? ?4/4: Admitted and taken for ERCP by Dr. Servando Snare for sphincterotomy, sludge in duct ?4/5: Cholecystectomy ?4/6-4/8: Persistent vomiting, inability to eat, CT angiogram shows small PE ?4/9: Psychiatry consulted, no risk of harm to self ?4/11: HIDA performed, no bile leak, GI consulted for assistance with nausea ?  ? ? ? ? ? ?Assessment and Plan: ?* Choledocholithiasis ?Admitted and underwent ERCP and then cholecystectomy.  Then persistent pain, see below ? ?Intractable nausea and vomiting and pain ?Since cholecystectomy, patient has had now 5 days of all solid or liquid intake except water.   ? ?She has had no fever, leukocytosis.  She has had no elevated lipase.   CT abdomen and pelvis with oral and IV contrast was obtained, that shows no pancreatitis, abdominal fluid collection, intra-abdominal free air.  There is been a decrease in size of the common bile duct and all findings on CT were expected postsurgical changes postcholecystectomy.  HIDA normal. ? ?-Continue IV fluids ?-Consult to IR for post-pyloric Dobhoff tomorrow then start tube feeds ?-Consult GI for recommendations re: third or fourth line nausea management ? ? ?Pain is migratory.  Now leaving the window when severe pain would be expected. ?- Stop lyrica ?- Start to space out PO Dilaudid and wean ? ?  ? ?Acute pulmonary embolism (HCC) ?Probably incidental finding.  Discussed with radiology, this was very small.  LE dopplers normal. ?- Continue new Xarelto, would favor shorter course ? ?Hypokalemia ?Supplemented ? ?Normocytic anemia ?Likely expected  after delivery  ?- Follow-up with PCP ? ?Asthma, chronic ?No evidence of flare ?-Continue ICS/LAMA ? ? ?Depression ?Psychiatry consult requested by Patient due to post-partum depression. ?- Continue sertraline ?- Consult Psychiatry ? ? ? ? ? ? ? ? ? ?Subjective: Patient still vomiting any thing other than water.  No significant abdominal pain ? ? ? ? ?Physical Exam: ?Vitals:  ? 09/20/21 0342 09/20/21 0823 09/20/21 1327 09/20/21 1554  ?BP: 105/63 106/78 107/69 105/78  ?Pulse: 70 77 73 73  ?Resp: 17 18 16 20   ?Temp: (!) 97.5 ?F (36.4 ?C) 98 ?F (36.7 ?C) 98.7 ?F (37.1 ?C) 98.2 ?F (36.8 ?C)  ?TempSrc: Oral Oral Oral Oral  ?SpO2: 94% 96% 97% 97%  ?Weight:      ?Height:      ? ?Adult female, lying in bed, interactive, appropriate ?Attention normal, affect blunted, judgment insight appear normal.  Speech fluent, face symmetric ? ?Data Reviewed: ?Discussed with general surgery, psychiatry. ?Nursing notes reviewed, vital signs reviewed. ?HIDA scan report reviewed, negative ?Patient metabolic panel and CBC unremarkable ? ?Family Communication: Grandmother at the bedside ? ? ? ?Disposition: ?Status is: Inpatient ? ?Patient had choledocholithiasis followed by cholecystectomy. ? ?Her postoperative course has been complicated by inability to tolerate advancement of diet. ? ?We have now reached 7 days without blood in advance diet, and general surgery recommends we place a Dobbhoff tube to begin enteral feedings while she continues to attempt to eat without vomiting. ? ?We will also consult GI for assistance ? ? ? ? ? ? ? ? ? ? ? ? ? ?  Author: ?Alberteen Sam, MD ?09/20/2021 6:17 PM ? ?For on call review [email or web address].  ? ? ?

## 2021-09-20 NOTE — Consult Note (Signed)
This provider saw patient again this date.  Per report of RN, patient has continued to be tearful, does not keep any solid food down, feels like she did not get to say everything she wanted to say to this provider yesterday.  Upon approach, patient reports that she feels that maybe she did not say what she needed to because she was "woken up from sleep."  She wanted to make sure that she gave this provider a clear picture of her feelings.  Patient is still rather sleepy but is able to attend.  She remains tearful.  This provider shared with patient what she told me, and patient did not have anything else to add.  Patient clearly states that she is not suicidal, but continues to feel very bad over "causing so much trouble."  She thinks that providers think that she is making herself throw up.  Provided support and encouragement that everyone is trying to find out why she still is throwing up, that we are here to help her in any way.  Patient continues to express guilt around having her mother take care of her infant daughter.  Encouraged patient to just focus on getting better and we will be here to support her and worked on the management of her symptoms, including depression.  Patient did share that she remembered that she was on the [place] when she was a teenager, about 26 years of age, and she felt that this was not helpful.  ?This provider asked if she felt she needed psychiatric hospitalization.  Patient said no she does not.  Patient and then wanted to end the conversation, stating "I did not think this was the way the conversation was going to go, I am done talking now."  Provider shared with her that we do not feel that she needs psychiatric hospitalization at this time, and encouraged her to voice any concerns that she had.  Patient just stated she wanted to take a nap and this provider left. ? ?Writer discussed options with Dr. Maryfrances Bunnell.  Discussed the possibility of decreasing/discontinuing the Lyrica, as  well as spacing Dilaudid out further.  We elected to continue Zoloft for now. ?Writer discussed above with Dr. Toni Amend. ? ?Vanetta Mulders, PMHNP-BC ? ?

## 2021-09-20 NOTE — Progress Notes (Signed)
PRN Phenergan given as pre-med in attempt for patient to try ice chips and italian ice this morning. Encouraged patient to ask family to bring in full liquid options that sound good this morning (smoothie, milkshake, etc) since she has tried various things on the menu and vomited.  ? ?C/o sore throat from vomiting- chloraseptic spray provided.  ? ? ?

## 2021-09-21 MED ORDER — BOOST / RESOURCE BREEZE PO LIQD CUSTOM
1.0000 | Freq: Three times a day (TID) | ORAL | Status: DC
  Administered 2021-09-21 – 2021-09-22 (×4): 1 via ORAL

## 2021-09-21 MED ORDER — METOCLOPRAMIDE HCL 10 MG/10ML PO SOLN
5.0000 mg | Freq: Three times a day (TID) | ORAL | Status: DC
Start: 2021-09-21 — End: 2021-09-22
  Administered 2021-09-21 – 2021-09-22 (×6): 5 mg via ORAL
  Filled 2021-09-21: qty 5
  Filled 2021-09-21 (×2): qty 10
  Filled 2021-09-21: qty 5
  Filled 2021-09-21: qty 10
  Filled 2021-09-21: qty 5
  Filled 2021-09-21 (×3): qty 10

## 2021-09-21 MED ORDER — PANTOPRAZOLE SODIUM 40 MG IV SOLR
40.0000 mg | Freq: Two times a day (BID) | INTRAVENOUS | Status: DC
Start: 2021-09-21 — End: 2021-09-22
  Administered 2021-09-21 – 2021-09-22 (×3): 40 mg via INTRAVENOUS
  Filled 2021-09-21 (×4): qty 10

## 2021-09-21 MED ORDER — SUCRALFATE 1 GM/10ML PO SUSP
1.0000 g | Freq: Three times a day (TID) | ORAL | Status: DC
  Administered 2021-09-21 – 2021-09-22 (×6): 1 g via ORAL
  Filled 2021-09-21 (×7): qty 10

## 2021-09-21 NOTE — Consult Note (Signed)
? ? ? ?Cephas Darby, MD ?[address]  ?[address]  ?[place], [place] [address]  ?Main: [phone]  ?Fax: [phone] ?Pager: [phone] ? ? Consultation ? ?Referring Provider:     No ref. provider found ?Primary Care Physician:  Pcp, No ?Primary Gastroenterologist:  Dr. Lucilla Lame        ?Reason for Consultation:     Intractable nausea and vomiting, poor p.o. intake ? ?Date of Admission:  09/13/2021 ?Date of Consultation:  09/21/2021 ?       ? HPI:   ?Kim Lynn is a 26 y.o. female with history of Joubert syndrome, depression, on March 10 is admitted on 09/13/2021 secondary to severe right upper quadrant pain, diagnosed with choledocholithiasis, s/p ERCP on 09/13/2021 s/p biliary sphincterotomy with removal of sludge, subsequently underwent laparoscopic cholecystectomy on 09/14/2021.  However, patient continued to have nausea after removing the gallbladder, was not able to advance the diet.  She tells me that her entire throat has been hurting, burning in her stomach.  She is not able to tolerate popsicles as well.  No evidence of post ERCP pancreatitis.She underwent repeat imaging, which did not reveal any bile leak, underwent CT abdomen and pelvis with contrast on 4/8 which was unrevealing.  She underwent CT angio chest which revealed right lower lobe PE, which is incidental and was started on Xarelto.  Patient reports that she was experiencing severe reflux throughout her pregnancy.  She does have history of constipation as well, her last bowel movement was Monday last week ? ?Patient's nausea and vomiting have been managed with Zofran, Phenergan.  She has been receiving Dilaudid every 6 hours for epigastric pain.  Serum lipase was normal.  She was also started on colestipol which did not seem to help.  Patient's grandfather is bedside.  She reports that she has been pumping milk.  She is desperate to go home and originally plan was made for postpyloric Dobbhoff placement because patient has been  complaining that she is feeling very weak and would like to have some nutrition. ? ? ?NSAIDs: None ? ?Antiplts/Anticoagulants/Anti thrombotics: None ? ?GI Procedures: None ? ?Past Medical History:  ?Diagnosis Date  ? Anxiety   ? Rosanna Randy syndrome   ? ? ?Past Surgical History:  ?Procedure Laterality Date  ? ERCP N/A 09/13/2021  ? Procedure: ENDOSCOPIC RETROGRADE CHOLANGIOPANCREATOGRAPHY (ERCP);  Surgeon: Lucilla Lame, MD;  Location: [facility] ENDOSCOPY;  Service: Endoscopy;  Laterality: N/A;  ? KNEE ARTHROSCOPY WITH ANTERIOR CRUCIATE LIGAMENT (ACL) REPAIR    ? TONSILLECTOMY    ? ?Current Facility-Administered Medications:  ?  ALPRAZolam Duanne Moron) tablet 0.25 mg, 0.25 mg, Oral, BID PRN, Edwin Dada, MD, 0.25 mg at 09/20/21 1746 ?  alum & mag hydroxide-simeth (MAALOX/MYLANTA) 200-200-20 MG/5ML suspension 30 mL, 30 mL, Oral, Q6H PRN, Piscoya, Jose, MD, 30 mL at 09/19/21 0519 ?  dextrose 5 % and 0.9 % NaCl with KCl 20 mEq/L infusion, , Intravenous, Continuous, Danford, Suann Larry, MD, Last Rate: 125 mL/hr at 09/21/21 1057, New Bag at 09/21/21 1057 ?  feeding supplement (BOOST / RESOURCE BREEZE) liquid 1 Container, 1 Container, Oral, TID BM, Malaky Tetrault, Tally Due, MD ?  HYDROmorphone (DILAUDID) tablet 2 mg, 2 mg, Oral, Q6H PRN, Danford, Suann Larry, MD, 2 mg at 09/21/21 1149 ?  metoCLOPramide (REGLAN) 10 MG/10ML solution 5 mg, 5 mg, Oral, TID AC & HS, Anett Ranker, Tally Due, MD ?  ondansetron [facility]) tablet 4 mg, 4 mg, Oral, Q6H PRN, 4 mg at 09/21/21 1059 **OR**  ondansetron (ZOFRAN) injection 4 mg, 4 mg, Intravenous, Q6H PRN, Piscoya, Jose, MD, 4 mg at 09/19/21 0511 ?  pantoprazole (PROTONIX) injection 40 mg, 40 mg, Intravenous, Q12H, Peng Thorstenson, Tally Due, MD ?  phenol (CHLORASEPTIC) mouth spray 1 spray, 1 spray, Mouth/Throat, PRN, Danford, Suann Larry, MD ?  polyethylene glycol (MIRALAX / GLYCOLAX) packet 17 g, 17 g, Oral, Daily, Danford, Suann Larry, MD, 17 g at 09/21/21 1054 ?  promethazine (PHENERGAN) tablet 25  mg, 25 mg, Oral, Q6H PRN, Tylene Fantasia, PA-C ?  Rivaroxaban (XARELTO) tablet 15 mg, 15 mg, Oral, BID WC, 15 mg at 09/21/21 1055 **FOLLOWED BY** [START ON 10/09/2021] rivaroxaban (XARELTO) tablet 20 mg, 20 mg, Oral, Q supper, Danford, Suann Larry, MD ?  sertraline (ZOLOFT) tablet 100 mg, 100 mg, Oral, Daily, Linda Hedges, CNM, 100 mg at 09/21/21 1055 ?  sucralfate (CARAFATE) 1 GM/10ML suspension 1 g, 1 g, Oral, TID WC & HS, Ifeanyi Mickelson, Tally Due, MD ? ? ? ?History reviewed. No pertinent family history.  ? ?Social History  ? ?Tobacco Use  ? Smoking status: Every Day  ? Smokeless tobacco: Never  ?Vaping Use  ? Vaping Use: Every day  ?Substance Use Topics  ? Alcohol use: Not Currently  ? Drug use: Never  ? ? ?Allergies as of 09/13/2021 - Review Complete 09/13/2021  ?Allergen Reaction Noted  ? Aspirin Other (See Comments) 12/21/2011  ? Tylenol [acetaminophen] Other (See Comments) 12/21/2011  ? ? ?Review of Systems:    ?All systems reviewed and negative except where noted in HPI. ? ? Physical Exam:  ?Vital signs in last 24 hours: ?Temp:  [97.6 ?F (36.4 ?C)-98.7 ?F (37.1 ?C)] 97.8 ?F (36.6 ?C) (04/12 1129) ?Pulse Rate:  [71-80] 73 (04/12 1129) ?Resp:  [16-20] 20 (04/12 1129) ?BP: (100-113)/(58-79) 105/69 (04/12 1129) ?SpO2:  [97 %-100 %] 99 % (04/12 1129) ?Last BM Date : 09/12/21 ?General:   Sad, cooperative in NAD ?Head:  Normocephalic and atraumatic. ?Eyes:   No icterus.   Conjunctiva pink. PERRLA. ?Ears:  Normal auditory acuity. ?Neck:  Supple; no masses or thyroidomegaly ?Lungs: Respirations even and unlabored. Lungs clear to auscultation bilaterally.   No wheezes, crackles, or rhonchi.  ?Heart:  Regular rate and rhythm;  Without murmur, clicks, rubs or gallops ?Abdomen:  Soft, nondistended, mild epigastric tenderness. Normal bowel sounds. No appreciable masses or hepatomegaly.  No rebound or guarding.  ?Rectal:  Not performed. ?Msk:  Symmetrical without gross deformities.  Generalized weakness ?Extremities:   Without edema, cyanosis or clubbing. ?Neurologic:  Alert and oriented x3;  grossly normal neurologically. ?Skin:  Intact without significant lesions or rashes. ?Psych:  Alert and cooperative. Normal affect. ? ?LAB RESULTS: ? ?  Latest Ref Rng & Units 09/20/2021  ?  5:25 AM 09/19/2021  ?  7:06 AM 09/18/2021  ?  1:50 AM  ?CBC  ?WBC 4.0 - 10.5 K/uL 6.1   5.4   7.4    ?Hemoglobin 12.0 - 15.0 g/dL 10.1   10.3   9.6    ?Hematocrit 36.0 - 46.0 % 32.9   32.8   31.0    ?Platelets 150 - 400 K/uL 349   324   313    ? ? ?BMET ? ?  Latest Ref Rng & Units 09/20/2021  ?  5:25 AM 09/19/2021  ?  7:06 AM 09/18/2021  ?  1:50 AM  ?BMP  ?Glucose 70 - 99 mg/dL 104   109   104    ?BUN 6 - 20 mg/dL <5   <  5   <5    ?Creatinine 0.44 - 1.00 mg/dL 0.89   0.96   0.96    ?Sodium 135 - 145 mmol/L 140   139   139    ?Potassium 3.5 - 5.1 mmol/L 4.0   3.8   3.7    ?Chloride 98 - 111 mmol/L 107   107   106    ?CO2 22 - 32 mmol/L '29   27   28    ' ?Calcium 8.9 - 10.3 mg/dL 8.4   8.4   8.2    ? ? ?LFT ? ?  Latest Ref Rng & Units 09/20/2021  ?  5:25 AM 09/17/2021  ?  4:25 AM 09/16/2021  ?  6:02 AM  ?Hepatic Function  ?Total Protein 6.5 - 8.1 g/dL 6.2   6.5   5.5    ?Albumin 3.5 - 5.0 g/dL 3.2   3.2   2.8    ?AST 15 - 41 U/L '20   23   19    ' ?ALT 0 - 44 U/L 30   33   34    ?Alk Phosphatase 38 - 126 U/L 63   67   65    ?Total Bilirubin 0.3 - 1.2 mg/dL 1.5   1.3   1.2    ? ? ? ?STUDIES: ?DG Abd 1 View ? ?Result Date: 09/20/2021 ?CLINICAL DATA:  26 year old female with a history of dominant pain and vomiting EXAM: ABDOMEN - 1 VIEW COMPARISON:  CT 09/12/2021 FINDINGS: Retained oral contrast within the colon. No abnormal distension. Gas within stomach and small bowel. No abnormal distension. No air-fluid levels. No unexpected radiopaque foreign body, calcification, or soft tissue density. IMPRESSION: Negative abdominal plain film Electronically Signed   By: Corrie Mckusick D.O.   On: 09/20/2021 15:31  ? ?NM HEPATOBILIARY LEAK (POST-SURGICAL) ? ?Result Date: 09/20/2021 ?CLINICAL  DATA:  Abdominal pain post recent cholecystectomy 09/14/2021, recent ERCP EXAM: NUCLEAR MEDICINE HEPATOBILIARY IMAGING TECHNIQUE: Sequential images of the abdomen were obtained out to 60 minutes following intra

## 2021-09-21 NOTE — Consult Note (Signed)
Provider saw patient face-to-face this afternoon. She is not sleepy today. Not tearful. Is much more alert, feeding baby a bottle. She states that she is feeling better. Lyrica has been discontinued and dilaudid reduced. She wants to continue on the sertraline. GI has consulted and treating for sx related to GERD during pregnancy. She had managed to tolerate a carton of Ensure without vomiting.  ? ?Psychiatry will consider that consult is complete at this time. Other providers can ask for another consult if needed.  ? ?Recommend that patient follow up with outpatient providers. ? ?Vanetta Mulders, PMHNP-BC ? ?

## 2021-09-21 NOTE — Progress Notes (Signed)
?PROGRESS NOTE ? ?Kim Lynn HER:[id number] DOB: 06/07/1996 DOA: 09/13/2021 ?PCP: Pcp, No ? ? LOS: 7 days  ? ?Brief Narrative / Interim history: ?Kim Lynn is a 26 y.o. F with asthma, recent pregnancy who presented with RUQ pain and nausea. In the ER, LFTs elevated, imaging with CT and US showed dilated bile ducts.  MRCP confirmed small stone.  On 4/4 GI took patient for an ERCP status post sphincterotomy, duct sludge.  General surgery took patient to the OR on 4/5 for cholecystectomy.  Following cholecystectomy, she has been having persistent right sided abdominal pain along with nausea and vomiting no p.o. intake.  HIDA scan on 4/11 was negative for bile leak.  Further work-up revealed an incidental small PE for which she has been placed on anticoagulation.  Due to ongoing postpartum depression psychiatry was consulted on 4/9.  GI reconsulted 4/12 due to persistent nausea, vomiting, poor p.o. intake ? ?Subjective / 24h Interval events: ?She is afraid to eat.  Complains of right upper quadrant abdominal pain currently.  No nausea but has not had anything to eat or drink ? ?Assesement and Plan: ?Principal Problem: ?  Choledocholithiasis ?Active Problems: ?  Intractable nausea and vomiting and pain ?  Acute pulmonary embolism (HCC) ?  Depression ?  Asthma, chronic ?  Normocytic anemia ?  Hypokalemia ? ?Assessment and Plan: ?Principal problem ?Intractable nausea and vomiting and pain -this has been persistent since her cholecystectomy.  Has not had anything to eat for the past week except for sips of water here and there.  GI reconsulted, appreciate input.  Hold off on feeding tube/tube feeds for now, GI will adjust few medications today ? ?Active problems ?Choledocholithiasis -Admitted and underwent ERCP and then cholecystectomy.  Hospital course complicated by intractable nausea, vomiting as above. She has had no fever, leukocytosis.  She has had no elevated lipase.   CT abdomen and pelvis with oral and IV  contrast was obtained, that shows no pancreatitis, abdominal fluid collection, intra-abdominal free air.  There is been a decrease in size of the common bile duct and all findings on CT were expected postsurgical changes postcholecystectomy.  HIDA normal. ? ?Acute pulmonary embolism (HCC) -Probably incidental finding.  Discussed with radiology, this was very small.  LE dopplers normal.  Continue Xarelto ? ?Hypokalemia -Supplemented ? ?Normocytic anemia -Likely expected after delivery  ? ?Asthma, chronic -No evidence of flare ? ?Depression -Psychiatry consult requested by Patient due to post-partum depression. Continue sertraline ? ? ?Scheduled Meds: ? colestipol  2 g Oral BID  ? polyethylene glycol  17 g Oral Daily  ? Rivaroxaban  15 mg Oral BID WC  ? Followed by  ? [START ON 10/09/2021] rivaroxaban  20 mg Oral Q supper  ? sertraline  100 mg Oral Daily  ? ?Continuous Infusions: ? dextrose 5 % and 0.9 % NaCl with KCl 20 mEq/L 125 mL/hr at 09/21/21 1057  ? ?PRN Meds:.ALPRAZolam, alum & mag hydroxide-simeth, HYDROmorphone, naproxen, ondansetron **OR** ondansetron (ZOFRAN) IV, phenol, promethazine ? ?Diet Orders (From admission, onward)  ? ?  Start     Ordered  ? 09/20/21 1431  DIET SOFT Room service appropriate? Yes; Fluid consistency: Thin  Diet effective now       ?Question Answer Comment  ?Room service appropriate? Yes   ?Fluid consistency: Thin   ?  ? 09/20/21 1430  ? ?  ?  ? ?  ? ? ?DVT prophylaxis: SCDs Start: 09/13/21 0925 ?Rivaroxaban (XARELTO) tablet 15 mg  ?rivaroxaban (  XARELTO) tablet 20 mg  ? ?Lab Results  ?Component Value Date  ? PLT 349 09/20/2021  ? ? ?  Code Status: Full Code ? ?Family Communication: no family at bedside  ? ?Status is: Inpatient ? ?Remains inpatient appropriate because: persistent symptoms ? ?Level of care: Postpartum ? ?Consultants:  ?General surgery  ?GI ?Psychiatry  ? ?Procedures:  ?ERCP ?Cholecystectomy ? ?Microbiology  ?none ? ?Antimicrobials: ?none  ? ? ?Objective: ?Vitals:  ?  09/20/21 1925 09/20/21 2320 09/21/21 0320 09/21/21 0736  ?BP: 111/79 113/77 100/68 (!) 101/58  ?Pulse: 72 74 80 71  ?Resp: 18 17  18   ?Temp: 97.6 ?F (36.4 ?C) 97.7 ?F (36.5 ?C) 98.7 ?F (37.1 ?C) 98.6 ?F (37 ?C)  ?TempSrc: Oral Oral Oral Oral  ?SpO2: 100% 98% 97% 98%  ?Weight:      ?Height:      ? ? ?Intake/Output Summary (Last 24 hours) at 09/21/2021 1114 ?Last data filed at 09/21/2021 0238 ?Gross per 24 hour  ?Intake 240 ml  ?Output 1800 ml  ?Net -1560 ml  ? ?Wt Readings from Last 3 Encounters:  ?09/14/21 74.8 kg  ?08/16/21 82.6 kg  ?10/20/20 77.1 kg  ? ? ?Examination: ? ?Constitutional: NAD ?Eyes: no scleral icterus ?ENMT: Mucous membranes are moist.  ?Neck: normal, supple ?Respiratory: clear to auscultation bilaterally, no wheezing, no crackles.  ?Cardiovascular: Regular rate and rhythm, no murmurs / rubs / gallops. No LE edema.  ?Abdomen: non distended, no tenderness. Bowel sounds positive.  ?Musculoskeletal: no clubbing / cyanosis.  ?Skin: no rashes ?Neurologic: non focal  ? ?Data Reviewed: I have independently reviewed following labs and imaging studies  ? ?CBC ?Recent Labs  ?Lab 09/16/21 ?0602 09/17/21 ?0425 09/18/21 ?0150 09/19/21 ?11/19/21 09/20/21 ?11/20/21  ?WBC 8.0 6.6 7.4 5.4 6.1  ?HGB 9.1* 10.6* 9.6* 10.3* 10.1*  ?HCT 29.2* 34.1* 31.0* 32.8* 32.9*  ?PLT 297 348 313 324 349  ?MCV 87.7 88.3 87.3 86.1 88.0  ?MCH 27.3 27.5 27.0 27.0 27.0  ?MCHC 31.2 31.1 31.0 31.4 30.7  ?RDW 14.4 14.6 14.6 14.4 14.5  ? ? ?Recent Labs  ?Lab 09/15/21 ?11/15/21 09/16/21 ?0602 09/17/21 ?0425 09/17/21 ?1802 09/18/21 ?0150 09/19/21 ?11/19/21 09/20/21 ?11/20/21  ?NA 137 138 141  --  139 139 140  ?K 4.4 3.3* 3.6  --  3.7 3.8 4.0  ?CL 106 105 109  --  106 107 107  ?CO2 24 25 26   --  28 27 29   ?GLUCOSE 124* 97 102*  --  104* 109* 104*  ?BUN 7 8 5*  --  <5* <5* <5*  ?CREATININE 0.77 0.91 0.85  --  0.96 0.96 0.89  ?CALCIUM 8.2* 7.9* 8.3*  --  8.2* 8.4* 8.4*  ?AST 31 19 23   --   --   --  20  ?ALT 54* 34 33  --   --   --  30  ?ALKPHOS 88 65 67  --   --   --   63  ?BILITOT 1.4* 1.2 1.3*  --   --   --  1.5*  ?ALBUMIN 3.2* 2.8* 3.2*  --   --   --  3.2*  ?DDIMER  --   --   --  2.06*  --   --   --   ?TSH  --   --   --   --   --  4.164  --   ? ? ?------------------------------------------------------------------------------------------------------------------ ?No results for input(s): CHOL, HDL, LDLCALC, TRIG, CHOLHDL, LDLDIRECT in the last 72 hours. ? ?  No results found for: HGBA1C ?------------------------------------------------------------------------------------------------------------------ ?Recent Labs  ?  09/19/21 ?0706  ?TSH 4.164  ? ? ?Cardiac Enzymes ?No results for input(s): CKMB, TROPONINI, MYOGLOBIN in the last 168 hours. ? ?Invalid input(s): CK ?------------------------------------------------------------------------------------------------------------------ ?No results found for: BNP ? ?CBG: ?No results for input(s): GLUCAP in the last 168 hours. ? ?Recent Results (from the past 240 hour(s))  ?Surgical pcr screen     Status: Abnormal  ? Collection Time: 09/13/21 10:57 PM  ? Specimen: Nasal Mucosa; Nasal Swab  ?Result Value Ref Range Status  ? MRSA, PCR NEGATIVE NEGATIVE Final  ? Staphylococcus aureus POSITIVE (A) NEGATIVE Final  ?  Comment: (NOTE) ?The Xpert SA Assay (FDA approved for NASAL specimens in patients 2222 ?years of age and older), is one component of a comprehensive ?surveillance program. It is not intended to diagnose infection nor to ?guide or monitor treatment. ?Performed at [facility], [address] [place], ?[place] [address] ?  ?Urine Culture     Status: Abnormal  ? Collection Time: 09/17/21  6:00 PM  ? Specimen: Urine, Random  ?Result Value Ref Range Status  ? Specimen Description   Final  ?  URINE, RANDOM ?Performed at [facility], [address] [place], [place] [address] ?  ? Special Requests   Final  ?  NONE ?Performed at [facility], [address] [place], [place] [address] ?  ? Culture (A)  Final  ?   <10,000 COLONIES/mL INSIGNIFICANT GROWTH ?Performed at [facility], [address] [place], [place] [address] ?  ? Report Status 09/19/2021 FINAL  Final  ?  ? ?Radiology Studies: ?DG Abd 1 View ? ?Res

## 2021-09-22 LAB — BASIC METABOLIC PANEL
Anion gap: 4 — ABNORMAL LOW (ref 5–15)
BUN: 5 mg/dL — ABNORMAL LOW (ref 6–20)
CO2: 25 mmol/L (ref 22–32)
Calcium: 8.2 mg/dL — ABNORMAL LOW (ref 8.9–10.3)
Chloride: 108 mmol/L (ref 98–111)
Creatinine, Ser: 0.9 mg/dL (ref 0.44–1.00)
GFR, Estimated: >60 mL/min (ref >60)
Glucose, Bld: 99 mg/dL (ref 70–99)
Potassium: 4.2 mmol/L (ref 3.5–5.1)
Sodium: 137 mmol/L (ref 135–145)

## 2021-09-22 LAB — CBC
HCT: 31.3 % — ABNORMAL LOW (ref 36.0–46.0)
Hemoglobin: 9.8 g/dL — ABNORMAL LOW (ref 12.0–15.0)
MCH: 27.1 pg (ref 26.0–34.0)
MCHC: 31.3 g/dL (ref 30.0–36.0)
MCV: 86.7 fL (ref 80.0–100.0)
Platelets: 312 10*3/uL (ref 150–400)
RBC: 3.61 MIL/uL — ABNORMAL LOW (ref 3.87–5.11)
RDW: 14.1 % (ref 11.5–15.5)
WBC: 6.6 10*3/uL (ref 4.0–10.5)
nRBC: 0 % (ref 0.0–0.2)

## 2021-09-22 MED ORDER — METOCLOPRAMIDE HCL 10 MG/10ML PO SOLN: 5.0000 mg | Freq: Three times a day (TID) | ORAL | 0 refills | Status: AC

## 2021-09-22 MED ORDER — ONDANSETRON HCL 4 MG PO TABS: 4.0000 mg | ORAL_TABLET | Freq: Four times a day (QID) | ORAL | 0 refills | Status: AC | PRN

## 2021-09-22 MED ORDER — OMEPRAZOLE MAGNESIUM 20 MG PO TBEC: 40.0000 mg | DELAYED_RELEASE_TABLET | Freq: Two times a day (BID) | ORAL | 0 refills | Status: AC

## 2021-09-22 MED ORDER — RIVAROXABAN (XARELTO) VTE STARTER PACK (15 & 20 MG): ORAL_TABLET | ORAL | 0 refills | Status: AC

## 2021-09-22 MED ORDER — ALPRAZOLAM 0.25 MG PO TABS: 0.2500 mg | ORAL_TABLET | Freq: Two times a day (BID) | ORAL | 0 refills | Status: AC | PRN

## 2021-09-22 MED ORDER — SUCRALFATE 1 GM/10ML PO SUSP: 1.0000 g | Freq: Three times a day (TID) | ORAL | 0 refills | Status: AC

## 2021-09-22 NOTE — Discharge Summary (Signed)
? ?Physician Discharge Summary  ?Kim Lynn ZOX:[id number] DOB: Jul 25, 1995 DOA: 09/13/2021 ? ?PCP: Pcp, No ? ?Admit date: 09/13/2021 ?Discharge date: 09/22/2021 ? ?Admitted From: home ?Disposition:  home ? ?Recommendations for Outpatient Follow-up:  ?Follow up with PCP in 1-2 weeks ? ?Home Health: none ?Equipment/Devices: none ? ?Discharge Condition: stable ?CODE STATUS: Full code ?Diet recommendation: regular ? ?HPI: Per admitting MD, ?Kim Lynn is a 26 y.o. female with medical history significant for Sullivan LoneGilbert syndrome, depression, status post recent delivery about a month ago who presents to the ER for evaluation of right upper quadrant pain that started about 6 AM the day prior to her admission. ?Pain started in the right upper quadrant and was rated 8 x 10 in intensity at its worst with radiation across the anterior abdominal wall associated with nausea, vomiting and diarrhea. Patient had taken some Gas-X without improvement in her symptoms.She had similar pain when she was pregnant and assumed it was related to gas.  During that time the pain was initially intermittent but had a recent episode has been persistent for 24 hours prompting her visit to the ER.She denies having any fever, no chills, no yellow discoloration of her eyes or skin, no chest pain, no cough, no shortness of breath, no urinary symptoms, no blurred vision no focal deficit. ? ?Hospital Course / Discharge diagnoses: ?Principal Problem: ?  Choledocholithiasis ?Active Problems: ?  Intractable nausea and vomiting and pain ?  Acute pulmonary embolism (HCC) ?  Depression ?  Asthma, chronic ?  Normocytic anemia ?  Hypokalemia ? ? ?Assessment and Plan: ?Principal problem ?Intractable nausea and vomiting and pain -patient has been having intractable nausea and vomiting, along with epigastric pain following her cholecystectomy.  General surgery as well as gastroenterology consulted.  Repeat imaging, and work-up was fairly unrevealing, GI felt that  this may be due to severe GERD which she has had during her pregnancy.  She was placed on PPI, Carafate, Reglan with some improvement in her symptoms.  Patient asking to go home, she will be discharged in stable condition with above-mentioned treatment.  Outpatient follow-up. ? ?Active problems ?Choledocholithiasis -Admitted and underwent ERCP and then cholecystectomy.  Hospital course complicated by intractable nausea, vomiting as above. She has had no fever, leukocytosis.  She has had no elevated lipase.   CT abdomen and pelvis with oral and IV contrast was obtained, that shows no pancreatitis, abdominal fluid collection, intra-abdominal free air.  There is been a decrease in size of the common bile duct and all findings on CT were expected postsurgical changes postcholecystectomy.  HIDA normal, no evidence of biliary leak. ?  ?Acute pulmonary embolism (HCC) -Probably incidental finding.  Discussed with radiology, this was very small.  LE dopplers normal.  Tolerating Xarelto well, continue upon discharge, she will likely need a short 3 months course ?Hypokalemia -Supplemented, potassium normalized this morning  ?Normocytic anemia -Likely expected after delivery, hemoglobin stable, no bleeding ?Asthma, chronic -No evidence of flare ?Depression -Psychiatry consult requested by Patient due to post-partum depression. Continue sertraline ? ?Sepsis ruled out ? ? ?Discharge Instructions ? ? ?Allergies as of 09/22/2021   ? ?   Reactions  ? Aspirin Other (See Comments)  ? Liver disorder  ? Tylenol [acetaminophen] Other (See Comments)  ? Liver disorder  ? ?  ? ?  ?Medication List  ?  ? ?TAKE these medications   ? ?Advair Diskus 250-50 MCG/ACT Aepb ?Generic drug: fluticasone-salmeterol ?Inhale 1 puff into the lungs every 12 (twelve)  hours. ?  ?ALPRAZolam 0.25 MG tablet ?Commonly known as: Prudy Feeler ?Take 1 tablet (0.25 mg total) by mouth 2 (two) times daily as needed for anxiety. ?  ?metoCLOPramide 10 MG/10ML Soln ?Commonly known  as: REGLAN ?Take 5 mLs (5 mg total) by mouth 3 (three) times daily before meals for 14 days. ?  ?multivitamin-prenatal 27-0.8 MG Tabs tablet ?Take 1 tablet by mouth daily at 12 noon. ?  ?omeprazole 20 MG tablet ?Commonly known as: PriLOSEC OTC ?Take 2 tablets (40 mg total) by mouth in the morning and at bedtime. ?  ?ondansetron 4 MG tablet ?Commonly known as: ZOFRAN ?Take 1 tablet (4 mg total) by mouth every 6 (six) hours as needed for nausea. ?  ?Rivaroxaban Stater Pack (15 mg and 20 mg) ?Commonly known as: XARELTO STARTER PACK ?Follow package directions: Take one 15mg  tablet by mouth twice a day. On day 22, switch to one 20mg  tablet once a day. Take with food. ?  ?sertraline 50 MG tablet ?Commonly known as: ZOLOFT ?Take 50 mg by mouth daily. ?  ?sucralfate 1 GM/10ML suspension ?Commonly known as: CARAFATE ?Take 10 mLs (1 g total) by mouth 4 (four) times daily -  with meals and at bedtime. ?  ? ?  ? ? Follow-up Information   ? ? , PA-C. Schedule an appointment as soon as possible for a visit in 3 week(s).   ?Specialty: Physician Assistant ?Why: s/p laproscopic cholecystectomy ?Contact information: ?[address] ?[address] ?[place] ?[phone] ? ? ?  ?  ? ?  ?  ? ?  ? ? ?Consultations: ?GI ?Surgery ? ?Procedures/Studies: ? ?DG Abd 1 View ? ?Result Date: 09/20/2021 ?CLINICAL DATA:  26 year old female with a history of dominant pain and vomiting EXAM: ABDOMEN - 1 VIEW COMPARISON:  CT 09/12/2021 FINDINGS: Retained oral contrast within the colon. No abnormal distension. Gas within stomach and small bowel. No abnormal distension. No air-fluid levels. No unexpected radiopaque foreign body, calcification, or soft tissue density. IMPRESSION: Negative abdominal plain film Electronically Signed   By: 30 D.O.   On: 09/20/2021 15:31  ? ?CT Angio Chest Pulmonary Embolism (PE) W or WO Contrast ? ?Result Date: 09/17/2021 ?CLINICAL DATA:  Positive D-dimer and shortness of breath EXAM: CT  ANGIOGRAPHY CHEST WITH CONTRAST TECHNIQUE: Multidetector CT imaging of the chest was performed using the standard protocol during bolus administration of intravenous contrast. Multiplanar CT image reconstructions and MIPs were obtained to evaluate the vascular anatomy. RADIATION DOSE REDUCTION: This exam was performed according to the departmental dose-optimization program which includes automated exposure control, adjustment of the mA and/or kV according to patient size and/or use of iterative reconstruction technique. CONTRAST:  47 mL Omnipaque 350. COMPARISON:  None FINDINGS: Cardiovascular: Thoracic aorta shows no aneurysmal dilatation or dissection. No cardiac enlargement is seen. No significant coronary calcifications are noted. The pulmonary artery shows a normal branching pattern bilaterally. Minimal pulmonary embolism is noted in the right lower lobe branches posteriorly. No right heart strain is identified. Mediastinum/Nodes: Thoracic inlet is within normal limits. No sizable hilar or mediastinal adenopathy is noted. The esophagus as visualized is within normal limits. Lungs/Pleura: Lungs are well aerated bilaterally. Mosaic attenuation is seen bilaterally consistent with air trapping. Minimal pleural effusions are noted bilaterally. No focal infiltrate or sizable parenchymal nodule is noted. Upper Abdomen: Visualized upper abdomen shows no acute abnormality. Musculoskeletal: No acute bony abnormality is seen. Review of the MIP images confirms the above findings. IMPRESSION: Minimal pulmonary emboli in the  right lower lobe posteriorly. No right heart strain is noted. No other acute abnormality is seen. These results will be called to the ordering clinician or representative by the Radiologist Assistant, and communication documented in the PACS or [employer]. Electronically Signed   By: Alcide Clever M.D.   On: 09/17/2021 20:43  ? ?CT ABDOMEN PELVIS W CONTRAST ? ?Result Date: 09/17/2021 ?CLINICAL DATA:   Abdominal pain, post-op. Recent ERCP and cholecystectomy. EXAM: CT ABDOMEN AND PELVIS WITH CONTRAST TECHNIQUE: Multidetector CT imaging of the abdomen and pelvis was performed using the standard protoco

## 2021-09-22 NOTE — Progress Notes (Signed)
Patient discharged.  Discharge instructions given to patient. Patient aware to pick up medications at pharmacy and how to take medications.  Patient aware to call PCP, Dr. Marius Ditch, and Dr. Hampton Abbot to schedule appointments for follow up.  Transported by nursing staff. ?

## 2021-09-22 NOTE — Progress Notes (Signed)
Patient was able to tolerate full liquid lunch.  Patient ambulated around nurses station X 2. ? ?

## 2021-09-22 NOTE — Progress Notes (Signed)
[facility] ?SURGICAL PROGRESS NOTE ? ?Hospital Day(s): 8.  ? ?Post op day(s): 8 Days Post-Op.  ? ?Interval History:  ?Patient seen and examined ?No acute events or new complaints overnight.  ?Patient reports she feels about the same ?She reports one episode of emesis yesterday but only had two boost shakes ?Abdomen is sore expectedly ?No fever, chills ?Laboratory work up continues to be reassuring ?She is on CLD but again not tolerating much  ? ? ?Vital signs in last 24 hours: [min-max] current  ?Temp:  [97.5 ?F (36.4 ?C)-98 ?F (36.7 ?C)] 97.6 ?F (36.4 ?C) (04/12 2342) ?Pulse Rate:  [63-73] 71 (04/12 2342) ?Resp:  [18-20] 18 (04/12 2342) ?BP: (94-111)/(65-69) 94/68 (04/12 2342) ?SpO2:  [98 %-100 %] 100 % (04/12 2342) ?Weight:  [76.5 kg] 76.5 kg (04/12 1925)     Height: 5\' 7"  (170.2 cm) Weight: 76.5 kg BMI (Calculated): 26.4  ? ?Intake/Output last 2 shifts:  ?04/12 0701 - 04/13 0700 ?In: 8075 [P.O.:660; I.V.:7415] ?Out: 2250 [Urine:2250]  ? ?Physical Exam:  ?Constitutional: alert, cooperative and no distress  ?Respiratory: breathing non-labored at rest  ?Cardiovascular: regular rate and sinus rhythm  ?Gastrointestinal: soft, expected and improving incisional soreness, and non-distended, no rebound/guarding ?Integumentary: Laparoscopic incisions are healing well, dermabond intact, no erythema or drainage  ? ?Labs:  ? ?  Latest Ref Rng & Units 09/22/2021  ?  5:56 AM 09/20/2021  ?  5:25 AM 09/19/2021  ?  7:06 AM  ?CBC  ?WBC 4.0 - 10.5 K/uL 6.6   6.1   5.4    ?Hemoglobin 12.0 - 15.0 g/dL 9.8   10.1   10.3    ?Hematocrit 36.0 - 46.0 % 31.3   32.9   32.8    ?Platelets 150 - 400 K/uL 312   349   324    ? ? ?  Latest Ref Rng & Units 09/22/2021  ?  5:56 AM 09/20/2021  ?  5:25 AM 09/19/2021  ?  7:06 AM  ?CMP  ?Glucose 70 - 99 mg/dL 99   104   109    ?BUN 6 - 20 mg/dL 5   <5   <5    ?Creatinine 0.44 - 1.00 mg/dL 0.90   0.89   0.96    ?Sodium 135 - 145 mmol/L 137   140   139    ?Potassium 3.5 - 5.1 mmol/L 4.2   4.0    3.8    ?Chloride 98 - 111 mmol/L 108   107   107    ?CO2 22 - 32 mmol/L 25   29   27     ?Calcium 8.9 - 10.3 mg/dL 8.2   8.4   8.4    ?Total Protein 6.5 - 8.1 g/dL  6.2     ?Total Bilirubin 0.3 - 1.2 mg/dL  1.5     ?Alkaline Phos 38 - 126 U/L  63     ?AST 15 - 41 U/L  20     ?ALT 0 - 44 U/L  30     ? ? ? ?Imaging studies: No new pertinent imaging studies ? ? ?Assessment/Plan:  ?26 y.o. female who continues to struggle with intractable nausea/emesis without clear etiology 8 Days Post-Op s/p robotic assisted laparoscopic cholecystectomy for choledocholithiasis, complicated by intractable nausea/emesis and small RLL PE ? ? - At this point, we have rule out any surgical complication (ex: bile leak, abscess, bowel injury) as source of her intractable symptoms. She is being followed by gastroenterology  and psychiatry as well for work up of this. I am not sure we have much more to add from a surgical perspective.  ?  ? - She is on CLD; no tolerating much. There have been multiple discussion regarding artifical nutrition options including PICC/TPN vs Dobhoff and enteral feedings. We have continued to hold off to give her every chance to avoid this, which is understandable.   ?- Monitor abdominal examination; on-going bowel function ?- Pain control prn; Antiemetics prn ?         - Mobilization as tolerated  ?         - Further management per primary service    ? ? - We will sign off for now and follow peripherally. We will be happy to help out however we can.   ? ?All of the above findings and recommendations were discussed with the patient, and the medical team, and all of patient's questions were answered to her expressed satisfaction. ? ?-- ?Edison Simon, PA-C ?[facility] ?09/22/2021, 7:42 AM ?M-F: 7am - 4pm ? ?

## 2021-09-22 NOTE — Progress Notes (Deleted)
?PROGRESS NOTE ? ?Kim Lynn GYI:[id number] DOB: September 02, 1995 DOA: 09/13/2021 ?PCP: Pcp, No ? ? LOS: 8 days  ? ?Brief Narrative / Interim history: ?Kim Lynn is a 26 y.o. F with asthma, recent pregnancy who presented with RUQ pain and nausea. In the ER, LFTs elevated, imaging with CT and US showed dilated bile ducts.  MRCP confirmed small stone.  On 4/4 GI took patient for an ERCP status post sphincterotomy, duct sludge.  General surgery took patient to the OR on 4/5 for cholecystectomy.  Following cholecystectomy, she has been having persistent right sided abdominal pain along with nausea and vomiting no p.o. intake.  HIDA scan on 4/11 was negative for bile leak.  Further work-up revealed an incidental small PE for which she has been placed on anticoagulation.  Due to ongoing postpartum depression psychiatry was consulted on 4/9.  GI reconsulted 4/12 due to persistent nausea, vomiting, poor p.o. intake ? ?Subjective / 24h Interval events: ?She was feeling a little bit better yesterday, was able to drink an Ensure.  She had a second Ensure a bit later, but then abdominal pain returned in half an hour afterwards she had another episode of emesis.  She complains of ongoing symptoms, she is afraid to eat this morning ? ?Assesement and Plan: ?Principal Problem: ?  Choledocholithiasis ?Active Problems: ?  Intractable nausea and vomiting and pain ?  Acute pulmonary embolism (HCC) ?  Depression ?  Asthma, chronic ?  Normocytic anemia ?  Hypokalemia ? ?Assessment and Plan: ?Principal problem ?Intractable nausea and vomiting and pain -this has been persistent since her cholecystectomy.  Has not had anything to eat for the past week except for sips of water here and there.  GI reconsulted, appreciate input, currently on PPI, Carafate, Reglan.  Some improvement yesterday early on but had vomiting again.  Appreciate GI follow-up, may need a repeat EGD per Dr. Allegra Lai.  Continue to hold on placing a feeding tube ? ?Active  problems ?Choledocholithiasis -Admitted and underwent ERCP and then cholecystectomy.  Hospital course complicated by intractable nausea, vomiting as above. She has had no fever, leukocytosis.  She has had no elevated lipase.   CT abdomen and pelvis with oral and IV contrast was obtained, that shows no pancreatitis, abdominal fluid collection, intra-abdominal free air.  There is been a decrease in size of the common bile duct and all findings on CT were expected postsurgical changes postcholecystectomy.  HIDA normal, no evidence of biliary leak. ? ?Acute pulmonary embolism (HCC) -Probably incidental finding.  Discussed with radiology, this was very small.  LE dopplers normal.  Tolerating Xarelto well ? ?Hypokalemia -Supplemented, potassium normalized this morning ? ?Normocytic anemia -Likely expected after delivery, hemoglobin stable, no bleeding ? ?Asthma, chronic -No evidence of flare ? ?Depression -Psychiatry consult requested by Patient due to post-partum depression. Continue sertraline ? ? ?Scheduled Meds: ? feeding supplement  1 Container Oral TID BM  ? metoCLOPramide  5 mg Oral TID AC & HS  ? pantoprazole (PROTONIX) IV  40 mg Intravenous Q12H  ? polyethylene glycol  17 g Oral Daily  ? Rivaroxaban  15 mg Oral BID WC  ? Followed by  ? [START ON 10/09/2021] rivaroxaban  20 mg Oral Q supper  ? sertraline  100 mg Oral Daily  ? sucralfate  1 g Oral TID WC & HS  ? ?Continuous Infusions: ? dextrose 5 % and 0.9 % NaCl with KCl 20 mEq/L 125 mL/hr at 09/22/21 0530  ? ?PRN Meds:.ALPRAZolam, alum & mag  hydroxide-simeth, HYDROmorphone, ondansetron **OR** ondansetron (ZOFRAN) IV, phenol, promethazine ? ?Diet Orders (From admission, onward)  ? ?  Start     Ordered  ? 09/21/21 1113  Diet clear liquid Room service appropriate? Yes; Fluid consistency: Thin  Diet effective now       ?Question Answer Comment  ?Room service appropriate? Yes   ?Fluid consistency: Thin   ?  ? 09/21/21 1114  ? ?  ?  ? ?  ? ? ?DVT prophylaxis: SCDs  Start: 09/13/21 0925 ?Rivaroxaban (XARELTO) tablet 15 mg  ?rivaroxaban (XARELTO) tablet 20 mg  ? ?Lab Results  ?Component Value Date  ? PLT 312 09/22/2021  ? ? ?  Code Status: Full Code ? ?Family Communication: no family at bedside  ? ?Status is: Inpatient ? ?Remains inpatient appropriate because: persistent symptoms ? ?Level of care: Postpartum ? ?Consultants:  ?General surgery  ?GI ?Psychiatry  ? ?Procedures:  ?ERCP ?Cholecystectomy ? ?Microbiology  ?none ? ?Antimicrobials: ?none  ? ? ?Objective: ?Vitals:  ? 09/21/21 1519 09/21/21 1909 09/21/21 1925 09/21/21 2342  ?BP: 111/69 94/65  94/68  ?Pulse: 65 63  71  ?Resp: 18 18  18   ?Temp: 98 ?F (36.7 ?C) (!) 97.5 ?F (36.4 ?C)  97.6 ?F (36.4 ?C)  ?TempSrc: Oral Oral  Oral  ?SpO2: 98% 100%  100%  ?Weight:   76.5 kg   ?Height:      ? ? ?Intake/Output Summary (Last 24 hours) at 09/22/2021 0800 ?Last data filed at 09/22/2021 0530 ?Gross per 24 hour  ?Intake 8075.02 ml  ?Output 2250 ml  ?Net 5825.02 ml  ? ? ?Wt Readings from Last 3 Encounters:  ?09/21/21 76.5 kg  ?08/16/21 82.6 kg  ?10/20/20 77.1 kg  ? ? ?Examination: ? ?Constitutional: NAD ?Eyes: lids and conjunctivae normal, no scleral icterus ?ENMT: mmm ?Neck: normal, supple ?Respiratory: clear to auscultation bilaterally, no wheezing, no crackles. Normal respiratory effort.  ?Cardiovascular: Regular rate and rhythm, no murmurs / rubs / gallops. No LE edema. ?Abdomen: soft, no distention, no tenderness. Bowel sounds positive.  ?Skin: no rashes ?Neurologic: no focal deficits, equal strength ? ?Data Reviewed: I have independently reviewed following labs and imaging studies  ? ?CBC ?Recent Labs  ?Lab 09/17/21 ?0425 09/18/21 ?0150 09/19/21 ?0706 09/20/21 ?11/20/21 09/22/21 ?09/24/21  ?WBC 6.6 7.4 5.4 6.1 6.6  ?HGB 10.6* 9.6* 10.3* 10.1* 9.8*  ?HCT 34.1* 31.0* 32.8* 32.9* 31.3*  ?PLT 348 313 324 349 312  ?MCV 88.3 87.3 86.1 88.0 86.7  ?MCH 27.5 27.0 27.0 27.0 27.1  ?MCHC 31.1 31.0 31.4 30.7 31.3  ?RDW 14.6 14.6 14.4 14.5 14.1   ? ? ? ?Recent Labs  ?Lab 09/16/21 ?0602 09/17/21 ?0425 09/17/21 ?1802 09/18/21 ?0150 09/19/21 ?11/19/21 09/20/21 ?11/20/21 09/22/21 ?09/24/21  ?NA 138 141  --  139 139 140 137  ?K 3.3* 3.6  --  3.7 3.8 4.0 4.2  ?CL 105 109  --  106 107 107 108  ?CO2 25 26  --  28 27 29 25   ?GLUCOSE 97 102*  --  104* 109* 104* 99  ?BUN 8 5*  --  <5* <5* <5* 5*  ?CREATININE 0.91 0.85  --  0.96 0.96 0.89 0.90  ?CALCIUM 7.9* 8.3*  --  8.2* 8.4* 8.4* 8.2*  ?AST 19 23  --   --   --  20  --   ?ALT 34 33  --   --   --  30  --   ?ALKPHOS 65 67  --   --   --  63  --   ?BILITOT 1.2 1.3*  --   --   --  1.5*  --   ?ALBUMIN 2.8* 3.2*  --   --   --  3.2*  --   ?DDIMER  --   --  2.06*  --   --   --   --   ?TSH  --   --   --   --  4.164  --   --   ? ? ? ?------------------------------------------------------------------------------------------------------------------ ?No results for input(s): CHOL, HDL, LDLCALC, TRIG, CHOLHDL, LDLDIRECT in the last 72 hours. ? ?No results found for: HGBA1C ?------------------------------------------------------------------------------------------------------------------ ?No results for input(s): TSH, T4TOTAL, T3FREE, THYROIDAB in the last 72 hours. ? ?Invalid input(s): FREET3 ? ? ?Cardiac Enzymes ?No results for input(s): CKMB, TROPONINI, MYOGLOBIN in the last 168 hours. ? ?Invalid input(s): CK ?------------------------------------------------------------------------------------------------------------------ ?No results found for: BNP ? ?CBG: ?No results for input(s): GLUCAP in the last 168 hours. ? ?Recent Results (from the past 240 hour(s))  ?Surgical pcr screen     Status: Abnormal  ? Collection Time: 09/13/21 10:57 PM  ? Specimen: Nasal Mucosa; Nasal Swab  ?Result Value Ref Range Status  ? MRSA, PCR NEGATIVE NEGATIVE Final  ? Staphylococcus aureus POSITIVE (A) NEGATIVE Final  ?  Comment: (NOTE) ?The Xpert SA Assay (FDA approved for NASAL specimens in patients 1322 ?years of age and older), is one component of a  comprehensive ?surveillance program. It is not intended to diagnose infection nor to ?guide or monitor treatment. ?Performed at [facility], [address] [place], ?[place] [id number] ?  ?Urine Culture     Status: Abnormal

## 2021-09-22 NOTE — Progress Notes (Signed)
Patient requesting to go home.  Patient states she feels better and has not had any vomiting today.  Message send to Dr. Elvera Lennox and Dr. Allegra Lai.  Orders for discharge received. ?

## 2021-09-22 NOTE — Progress Notes (Signed)
? ? ?Cephas Darby, MD ?[address]  ?[address]  ?[place], [place] [address]  ?Main: [phone]  ?Fax: [phone] ?Pager: [phone] ? ? ?Subjective: ?Pt had boost yesterday, did well. Had another bottle in the evening, she threw up.  She is no longer experiencing nausea.  Today, she has been doing well on boost and milkshake.  She is not experiencing epigastric pain. ? ?Objective: ?Vital signs in last 24 hours: ?Vitals:  ? 09/22/21 0831 09/22/21 1112 09/22/21 1534 09/22/21 1639  ?BP: 105/63 109/75 98/64 111/66  ?Pulse: 62 60 73   ?Resp: 18 18 18    ?Temp: 98.3 ?F (36.8 ?C) 98.2 ?F (36.8 ?C) 98.6 ?F (37 ?C) 98.2 ?F (36.8 ?C)  ?TempSrc: Oral Oral Oral Oral  ?SpO2: 97%     ?Weight:      ?Height:      ? ?Weight change:  ? ?Intake/Output Summary (Last 24 hours) at 09/22/2021 1709 ?Last data filed at 09/22/2021 1014 ?Gross per 24 hour  ?Intake 2371.05 ml  ?Output 1550 ml  ?Net 821.05 ml  ? ? ?Exam: ?Heart:: Regular rate and rhythm, S1S2 present, or without murmur or extra heart sounds ?Lungs: normal and clear to auscultation ?Abdomen: soft, nontender, normal bowel sounds ? ? ?Lab Results: ? ?  Latest Ref Rng & Units 09/22/2021  ?  5:56 AM 09/20/2021  ?  5:25 AM 09/19/2021  ?  7:06 AM  ?CBC  ?WBC 4.0 - 10.5 K/uL 6.6   6.1   5.4    ?Hemoglobin 12.0 - 15.0 g/dL 9.8   10.1   10.3    ?Hematocrit 36.0 - 46.0 % 31.3   32.9   32.8    ?Platelets 150 - 400 K/uL 312   349   324    ? ? ?  Latest Ref Rng & Units 09/22/2021  ?  5:56 AM 09/20/2021  ?  5:25 AM 09/19/2021  ?  7:06 AM  ?CMP  ?Glucose 70 - 99 mg/dL 99   104   109    ?BUN 6 - 20 mg/dL 5   <5   <5    ?Creatinine 0.44 - 1.00 mg/dL 0.90   0.89   0.96    ?Sodium 135 - 145 mmol/L 137   140   139    ?Potassium 3.5 - 5.1 mmol/L 4.2   4.0   3.8    ?Chloride 98 - 111 mmol/L 108   107   107    ?CO2 22 - 32 mmol/L 25   29   27     ?Calcium 8.9 - 10.3 mg/dL 8.2   8.4   8.4    ?Total Protein 6.5 - 8.1 g/dL  6.2     ?Total Bilirubin 0.3 - 1.2 mg/dL  1.5     ?Alkaline Phos 38 - 126 U/L   63     ?AST 15 - 41 U/L  20     ?ALT 0 - 44 U/L  30     ? ? ?Micro Results: ?Recent Results (from the past 240 hour(s))  ?Surgical pcr screen     Status: Abnormal  ? Collection Time: 09/13/21 10:57 PM  ? Specimen: Nasal Mucosa; Nasal Swab  ?Result Value Ref Range Status  ? MRSA, PCR NEGATIVE NEGATIVE Final  ? Staphylococcus aureus POSITIVE (A) NEGATIVE Final  ?  Comment: (NOTE) ?The Xpert SA Assay (FDA approved for NASAL specimens in patients 37 ?years of age and older), is one component of a comprehensive ?surveillance  program. It is not intended to diagnose infection nor to ?guide or monitor treatment. ?Performed at [facility], [place], ?[place] [address] ?  ?Urine Culture     Status: Abnormal  ? Collection Time: 09/17/21  6:00 PM  ? Specimen: Urine, Random  ?Result Value Ref Range Status  ? Specimen Description   Final  ?  URINE, RANDOM ?Performed at [facility], [address], [place], [place] [address] ?  ? Special Requests   Final  ?  NONE ?Performed at [facility], [address] [place], [place] [address] ?  ? Culture (A)  Final  ?  <10,000 COLONIES/mL INSIGNIFICANT GROWTH ?Performed at [facility], [place] [address], [place],  [address] ?  ? Report Status 09/19/2021 FINAL  Final  ? ?Studies/Results: ?No results found. ?Medications: I have reviewed the patient's current medications. ?Prior to Admission:  ?Medications Prior to Admission  ?Medication Sig Dispense Refill Last Dose  ? ADVAIR DISKUS 250-50 MCG/ACT AEPB Inhale 1 puff into the lungs every 12 (twelve) hours.   09/14/2021  ? sertraline (ZOLOFT) 50 MG tablet Take 50 mg by mouth daily.   09/13/2021  ? Prenatal Vit-Fe Fumarate-FA (MULTIVITAMIN-PRENATAL) 27-0.8 MG TABS tablet Take 1 tablet by mouth daily at 12 noon. (Patient not taking: Reported on 09/13/2021)   Not Taking  ? ?Scheduled: ? feeding supplement  1 Container Oral TID BM  ? metoCLOPramide  5 mg Oral TID AC & HS  ? pantoprazole  (PROTONIX) IV  40 mg Intravenous Q12H  ? polyethylene glycol  17 g Oral Daily  ? Rivaroxaban  15 mg Oral BID WC  ? Followed by  ? [START ON 10/09/2021] rivaroxaban  20 mg Oral Q supper  ? sertraline  100 mg Oral Daily  ? sucralfate  1 g Oral TID WC & HS  ? ?Continuous: ?[id number], alum & mag hydroxide-simeth, HYDROmorphone, ondansetron **OR** ondansetron (ZOFRAN) IV, phenol, promethazine ?Anti-infectives (From admission, onward)  ? ? Start     Dose/Rate Route Frequency Ordered Stop  ? 09/14/21 1732  ceFAZolin (ANCEF) 2-4 GM/100ML-% IVPB       ?Note to Pharmacy: Olena Mater F: cabinet override  ?    09/14/21 1732 09/14/21 1848  ? 09/14/21 0600  ceFAZolin (ANCEF) IVPB 2g/100 mL premix       ? 2 g ?200 mL/hr over 30 Minutes Intravenous On call to O.R. 09/13/21 2153 09/14/21 1846  ? ?  ? ?Scheduled Meds: ? feeding supplement  1 Container Oral TID BM  ? metoCLOPramide  5 mg Oral TID AC & HS  ? pantoprazole (PROTONIX) IV  40 mg Intravenous Q12H  ? polyethylene glycol  17 g Oral Daily  ? Rivaroxaban  15 mg Oral BID WC  ? Followed by  ? [START ON 10/09/2021] rivaroxaban  20 mg Oral Q supper  ? sertraline  100 mg Oral Daily  ? sucralfate  1 g Oral TID WC & HS  ? ?Continuous Infusions: ? ? ?PRN Meds:.ALPRAZolam, alum & mag hydroxide-simeth, HYDROmorphone, ondansetron **OR** ondansetron (ZOFRAN) IV, phenol, promethazine ? ? ?Assessment: ?Principal Problem: ?  Choledocholithiasis ?Active Problems: ?  Depression ?  Asthma, chronic ?  Normocytic anemia ?  Hypokalemia ?  Intractable nausea and vomiting and pain ?  Acute pulmonary embolism ([place]) ? ?Plan: ?Intractable nausea and vomiting, probably secondary to severe reflux as patient is responding to acid suppressive therapy ?Continue Protonix 40 mg IV twice daily, switch to omeprazole 40 mg p.o.  twice daily before meals upon discharge at least for 3 months ?Continue Carafate suspension every 6 hours as needed ?Switch Reglan to 5 mg p.o. 3 times daily before meals as needed,  continue as outpatient for 2 weeks only ?Advance to full liquid diet today and to soft diet as tolerated tomorrow and likely home in next 24 to 48 hours ?Patient can follow-up with me upon discharge ? ? LOS: 8 days  ? ?Kim Lynn ?09/22/2021, 5:09 PM ? ?

## 2022-06-25 IMAGING — DX DG ABDOMEN 1V
2 series · 2 of 2 positions shown · non-contrast
Comparison: CT 09/12/2021

CLINICAL DATA: 25-year-old female with a history of dominant pain
and vomiting

EXAM:
ABDOMEN - 1 VIEW

[abdomen supine (1 of 2)]
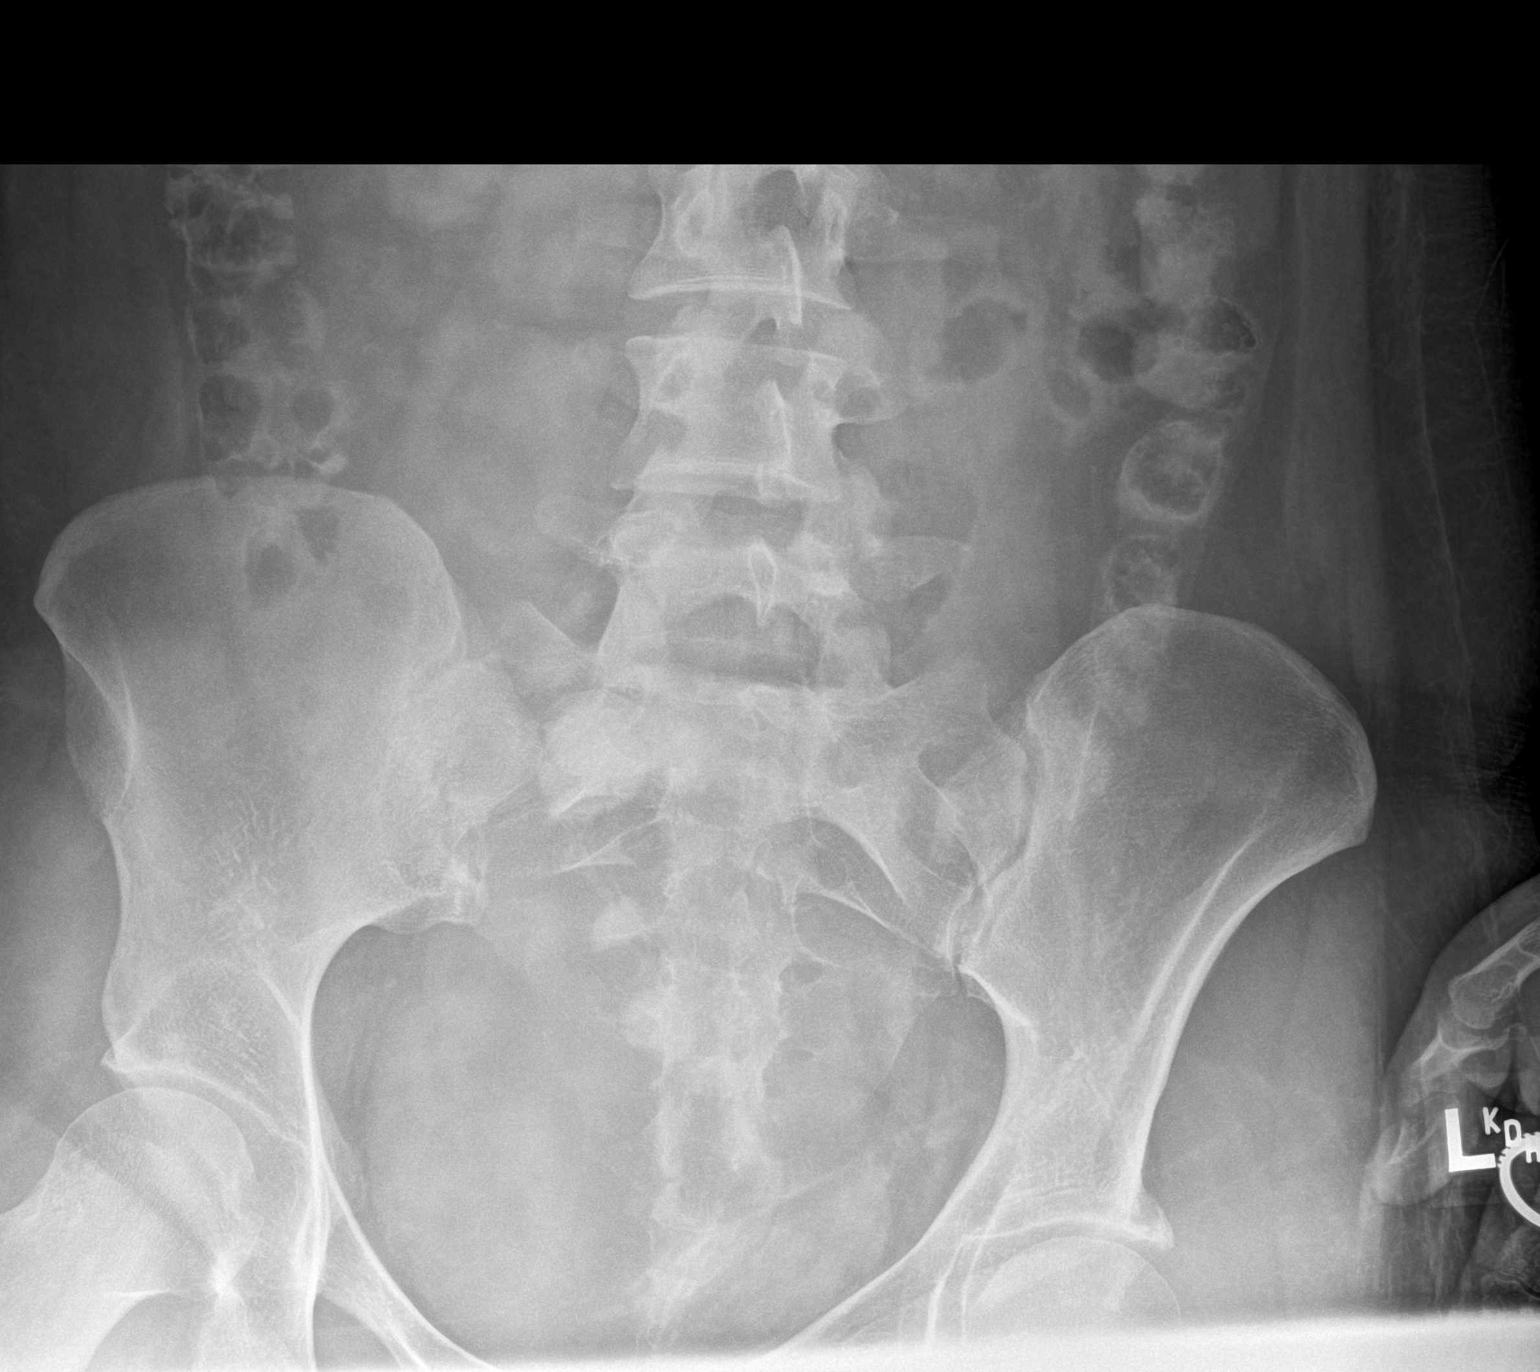

[abdomen supine (2 of 2)]
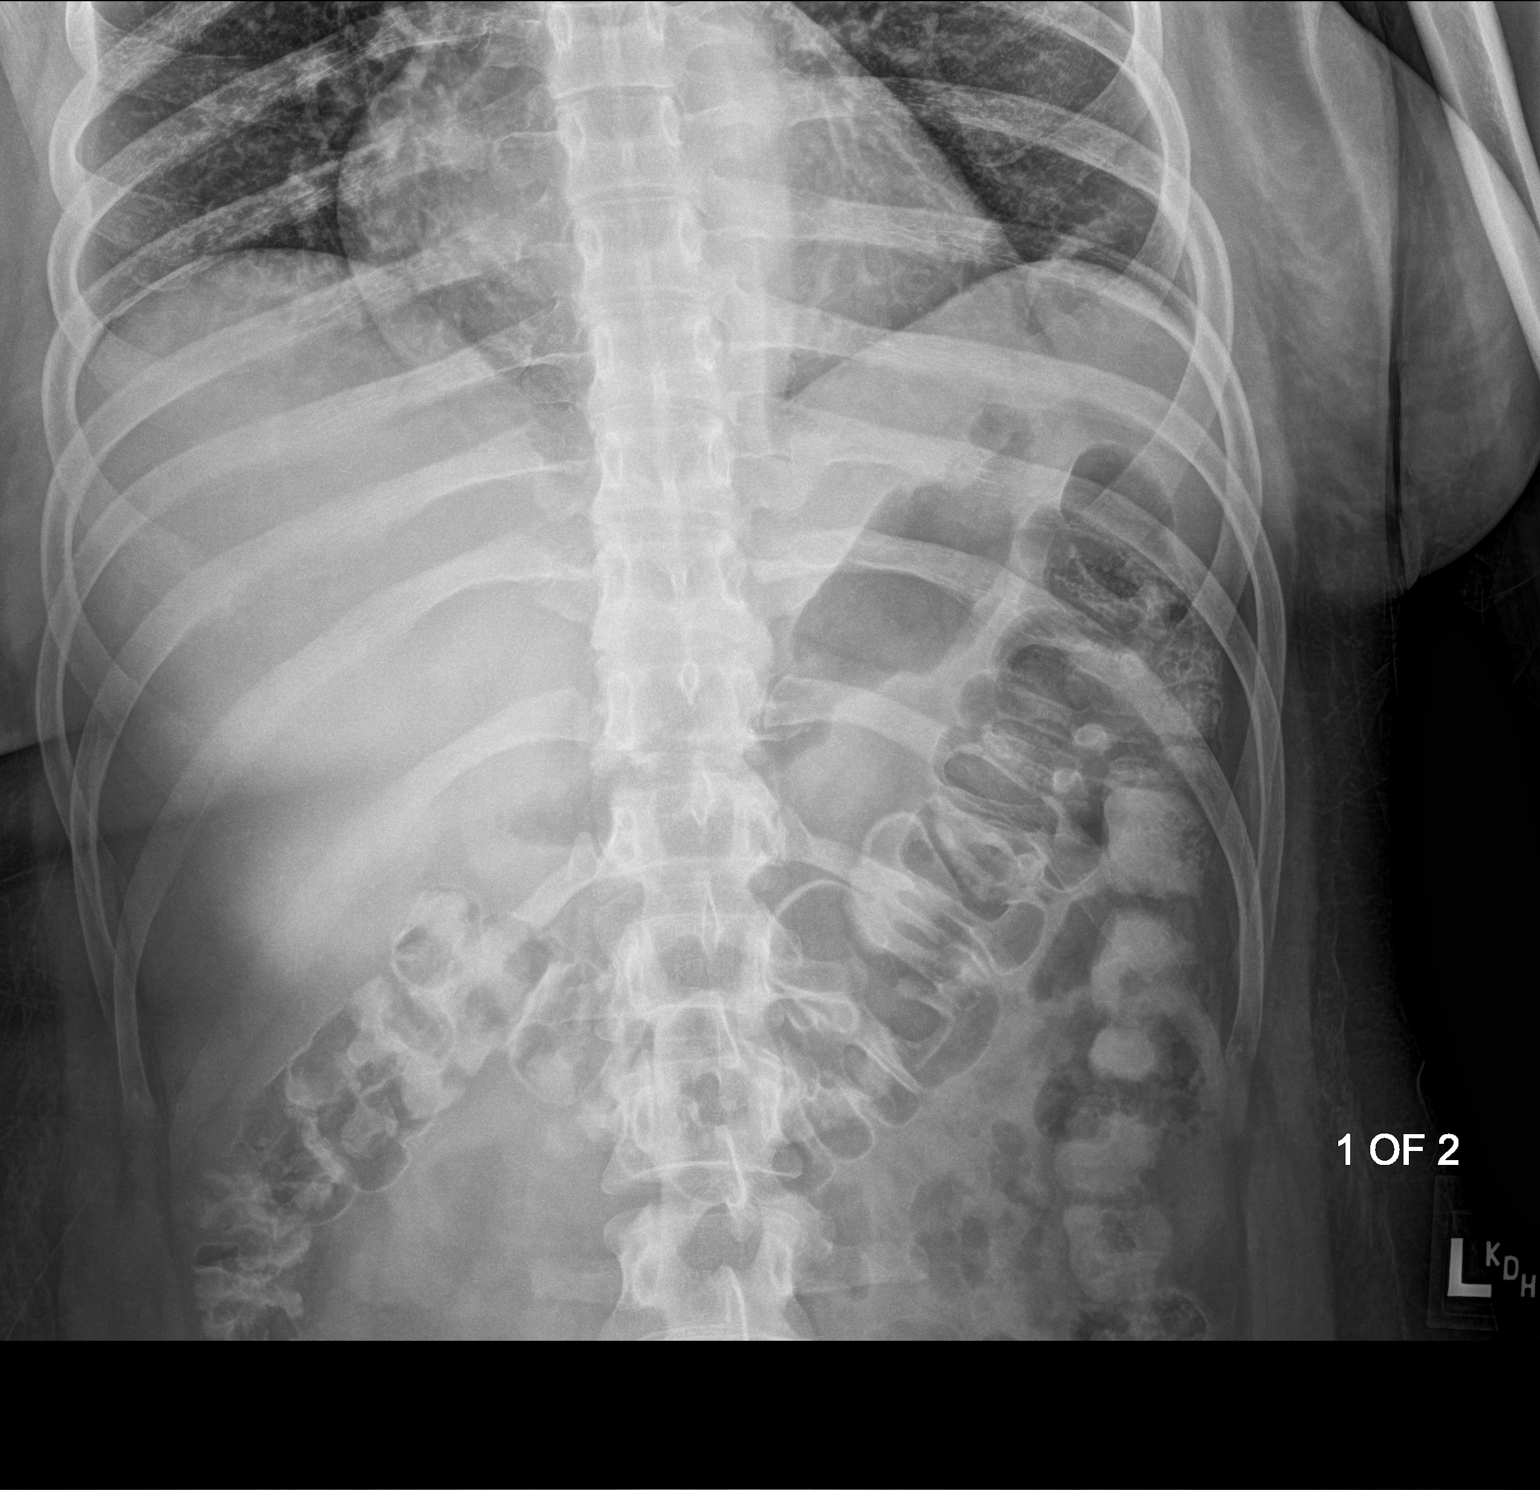

[2 of 2 positions shown; findings below may reference images not displayed]

FINDINGS: Retained oral contrast within the colon. No abnormal distension. Gas
within stomach and small bowel. No abnormal distension. No air-fluid
levels. No unexpected radiopaque foreign body, calcification, or
soft tissue density.
IMPRESSION: Negative abdominal plain film

## 2023-04-02 DIAGNOSIS — E8809 Other disorders of plasma-protein metabolism, not elsewhere classified: Secondary | ICD-10-CM | POA: Diagnosis not present

## 2023-04-02 DIAGNOSIS — E878 Other disorders of electrolyte and fluid balance, not elsewhere classified: Secondary | ICD-10-CM | POA: Diagnosis not present

## 2023-04-02 DIAGNOSIS — N12 Tubulo-interstitial nephritis, not specified as acute or chronic: Secondary | ICD-10-CM | POA: Diagnosis not present

## 2023-04-02 DIAGNOSIS — K5900 Constipation, unspecified: Secondary | ICD-10-CM | POA: Diagnosis not present

## 2023-04-02 DIAGNOSIS — E871 Hypo-osmolality and hyponatremia: Secondary | ICD-10-CM | POA: Diagnosis not present

## 2023-04-02 DIAGNOSIS — R1031 Right lower quadrant pain: Secondary | ICD-10-CM | POA: Diagnosis not present

## 2023-04-02 DIAGNOSIS — N2881 Hypertrophy of kidney: Secondary | ICD-10-CM | POA: Diagnosis not present

## 2023-04-02 DIAGNOSIS — D649 Anemia, unspecified: Secondary | ICD-10-CM | POA: Diagnosis not present

## 2023-04-02 DIAGNOSIS — Z9049 Acquired absence of other specified parts of digestive tract: Secondary | ICD-10-CM | POA: Diagnosis not present

## 2023-04-02 DIAGNOSIS — E876 Hypokalemia: Secondary | ICD-10-CM | POA: Diagnosis not present

## 2023-04-02 DIAGNOSIS — N10 Acute tubulo-interstitial nephritis: Secondary | ICD-10-CM | POA: Diagnosis not present

## 2023-04-03 DIAGNOSIS — N10 Acute tubulo-interstitial nephritis: Secondary | ICD-10-CM | POA: Diagnosis not present

## 2023-04-06 DIAGNOSIS — N12 Tubulo-interstitial nephritis, not specified as acute or chronic: Secondary | ICD-10-CM | POA: Diagnosis not present

## 2023-04-09 DIAGNOSIS — N12 Tubulo-interstitial nephritis, not specified as acute or chronic: Secondary | ICD-10-CM | POA: Diagnosis not present

## 2023-04-10 DIAGNOSIS — R3121 Asymptomatic microscopic hematuria: Secondary | ICD-10-CM | POA: Diagnosis not present

## 2023-04-10 DIAGNOSIS — N12 Tubulo-interstitial nephritis, not specified as acute or chronic: Secondary | ICD-10-CM | POA: Diagnosis not present

## 2023-04-10 DIAGNOSIS — E861 Hypovolemia: Secondary | ICD-10-CM | POA: Diagnosis not present

## 2023-04-10 DIAGNOSIS — N10 Acute tubulo-interstitial nephritis: Secondary | ICD-10-CM | POA: Diagnosis not present

## 2023-04-11 DIAGNOSIS — E861 Hypovolemia: Secondary | ICD-10-CM | POA: Diagnosis not present

## 2023-04-11 DIAGNOSIS — R3121 Asymptomatic microscopic hematuria: Secondary | ICD-10-CM | POA: Diagnosis not present

## 2023-04-11 DIAGNOSIS — N10 Acute tubulo-interstitial nephritis: Secondary | ICD-10-CM | POA: Diagnosis not present

## 2023-04-11 DIAGNOSIS — R109 Unspecified abdominal pain: Secondary | ICD-10-CM | POA: Diagnosis not present

## 2024-01-22 DIAGNOSIS — Z419 Encounter for procedure for purposes other than remedying health state, unspecified: Secondary | ICD-10-CM | POA: Diagnosis not present
